# Patient Record
Sex: Female | Born: 1980
Health system: Southern US, Community
[De-identification: ages and names within clinical notes are randomized; demographics above are authoritative.]

## PROBLEM LIST (undated history)

## (undated) HISTORY — PX: BREAST SURGERY: SHX581

---

## 2010-07-27 ENCOUNTER — Emergency Department (HOSPITAL_COMMUNITY)
Admission: EM | Admit: 2010-07-27 | Discharge: 2010-07-27 | Disposition: A | Discharge status: Home / Self Care | Payer: Self-pay | Attending: Emergency Medicine | Admitting: Emergency Medicine

## 2010-07-27 DIAGNOSIS — Z0000 Encounter for general adult medical examination without abnormal findings: Secondary | ICD-10-CM | POA: Insufficient documentation

## 2010-07-27 DIAGNOSIS — N898 Other specified noninflammatory disorders of vagina: Secondary | ICD-10-CM | POA: Insufficient documentation

## 2010-07-27 LAB — POCT PREGNANCY, URINE: Preg Test, Ur: NEGATIVE

## 2012-02-23 HISTORY — PX: AUGMENTATION MAMMAPLASTY: SUR837

## 2013-01-15 ENCOUNTER — Other Ambulatory Visit (HOSPITAL_COMMUNITY)
Admission: RE | Admit: 2013-01-15 | Discharge: 2013-01-15 | Disposition: A | Discharge status: Home / Self Care | Payer: Self-pay | Source: Ambulatory Visit | Attending: Gynecology | Admitting: Gynecology

## 2013-01-15 ENCOUNTER — Ambulatory Visit (INDEPENDENT_AMBULATORY_CARE_PROVIDER_SITE_OTHER): Payer: Self-pay | Admitting: Gynecology

## 2013-01-15 ENCOUNTER — Encounter: Payer: Self-pay | Admitting: Gynecology

## 2013-01-15 VITALS — BP 124/78 | Ht 65.5 in | Wt 126.0 lb

## 2013-01-15 DIAGNOSIS — R87810 Cervical high risk human papillomavirus (HPV) DNA test positive: Secondary | ICD-10-CM | POA: Insufficient documentation

## 2013-01-15 DIAGNOSIS — Z1151 Encounter for screening for human papillomavirus (HPV): Secondary | ICD-10-CM | POA: Insufficient documentation

## 2013-01-15 DIAGNOSIS — Z3009 Encounter for other general counseling and advice on contraception: Secondary | ICD-10-CM

## 2013-01-15 DIAGNOSIS — Z124 Encounter for screening for malignant neoplasm of cervix: Secondary | ICD-10-CM | POA: Insufficient documentation

## 2013-01-15 DIAGNOSIS — Z01419 Encounter for gynecological examination (general) (routine) without abnormal findings: Secondary | ICD-10-CM

## 2013-01-15 LAB — CBC WITH DIFFERENTIAL/PLATELET
Basophils Absolute: 0 10*3/uL (ref 0.0–0.1)
Eosinophils Relative: 2 % (ref 0–5)
Lymphocytes Relative: 33 % (ref 12–46)
Lymphs Abs: 2.2 10*3/uL (ref 0.7–4.0)
MCV: 84.3 fL (ref 78.0–100.0)
Neutro Abs: 3.7 10*3/uL (ref 1.7–7.7)
Neutrophils Relative %: 57 % (ref 43–77)
Platelets: 271 10*3/uL (ref 150–400)
RBC: 4.89 MIL/uL (ref 3.87–5.11)
RDW: 13.4 % (ref 11.5–15.5)
WBC: 6.5 10*3/uL (ref 4.0–10.5)

## 2013-01-15 LAB — CHOLESTEROL, TOTAL: Cholesterol: 175 mg/dL (ref 0–200)

## 2013-01-15 NOTE — Progress Notes (Signed)
Evelyn Beck 1981-01-08 [id number]   History:    32 y.o.  for annual gyn exam who stated that in [place] last year she was told that she had the HPV virus and had been tested because so was her husband? Patient is using barrier contraception and wanted to discuss contraceptive options. 4 months ago she had silicone implants placed in [place]. She has cycles that last 5-7 days once a month. She had 2 normal spontaneous vaginal deliveries at term without any complications. She declined a flu vaccine today.  Past medical history,surgical history, family history and social history were all reviewed and documented in the EPIC chart.  Gynecologic History Patient's last menstrual period was 12/23/2012. Contraception: condoms Last Pap: 2012-2013?Marland Kitchen Results were: HPV changes? Last mammogram: 2014 prior to placement of silicone implants in [place]. Results were: normal  Obstetric History OB History  Gravida Para Term Preterm AB SAB TAB Ectopic Multiple Living  2 2        2     # Outcome Date GA Lbr Len/2nd Weight Sex Delivery Anes PTL Lv  2 PAR           1 PAR                ROS: A ROS was performed and pertinent positives and negatives are included in the history.  GENERAL: No fevers or chills. HEENT: No change in vision, no earache, sore throat or sinus congestion. NECK: No pain or stiffness. CARDIOVASCULAR: No chest pain or pressure. No palpitations. PULMONARY: No shortness of breath, cough or wheeze. GASTROINTESTINAL: No abdominal pain, nausea, vomiting or diarrhea, melena or bright red blood per rectum. GENITOURINARY: No urinary frequency, urgency, hesitancy or dysuria. MUSCULOSKELETAL: No joint or muscle pain, no back pain, no recent trauma. DERMATOLOGIC: No rash, no itching, no lesions. ENDOCRINE: No polyuria, polydipsia, no heat or cold intolerance. No recent change in weight. HEMATOLOGICAL: No anemia or easy bruising or bleeding. NEUROLOGIC: No headache, seizures, numbness, tingling  or weakness. PSYCHIATRIC: No depression, no loss of interest in normal activity or change in sleep pattern.     Exam: chaperone present  BP 124/78  Ht 5' 5.5" (1.664 m)  Wt 126 lb (57.153 kg)  BMI 20.64 kg/m2  LMP 12/23/2012  Body mass index is 20.64 kg/(m^2).  General appearance : Well developed well nourished female. No acute distress HEENT: Neck supple, trachea midline, no carotid bruits, no thyroidmegaly Lungs: Clear to auscultation, no rhonchi or wheezes, or rib retractions  Heart: Regular rate and rhythm, no murmurs or gallops Breast:Examined in sitting and supine position were symmetrical in appearance, no palpable masses or tenderness,  no skin retraction, no nipple inversion, no nipple discharge, no skin discoloration, no axillary or supraclavicular lymphadenopathy,implants bilateral Abdomen: no palpable masses or tenderness, no rebound or guarding Extremities: no edema or skin discoloration or tenderness  Pelvic:  Bartholin, Urethra, Skene Glands: Within normal limits             Vagina: No gross lesions or discharge  Cervix: No gross lesions or discharge  Uterus  anteverted, normal size, shape and consistency, non-tender and mobile  Adnexa  Without masses or tenderness  Anus and perineum  normal   Rectovaginal  normal sphincter tone without palpated masses or tenderness             Hemoccult not indicated     Assessment/Plan:  32 y.o. female for annual exam who would like to try the NuvaRing for contraception  first. Literature information on the Nuvaring as well as the Mirena and ParaGard T380A was provided. Patient denies any bleeding disorders or clotting disorders in her family. The following labs were ordered CBC, screen cholesterol, urinalysis, and Pap smear with Cotesting. Patient was reminded to do her monthly breast exam.  Note: This dictation was prepared with  Dragon/digital dictation along withSmart phrase technology. Any transcriptional errors that result  from this process are unintentional.   Ok Edwards MD, 11:39 AM 01/15/2013

## 2013-01-16 LAB — URINALYSIS W MICROSCOPIC + REFLEX CULTURE
Bilirubin Urine: NEGATIVE
Crystals: NONE SEEN
Nitrite: NEGATIVE
Protein, ur: NEGATIVE mg/dL
Specific Gravity, Urine: 1.022 (ref 1.005–1.030)
Urobilinogen, UA: 0.2 mg/dL (ref 0.0–1.0)

## 2013-01-22 ENCOUNTER — Telehealth: Payer: Self-pay | Admitting: Gynecology

## 2013-01-22 NOTE — Telephone Encounter (Signed)
01/22/13-Pt was informed today that her cost for the Mirena & insertion would be $1.485.00. She is private pay. She will check and get back to [place] if she wants to proceed. Informed her this needs to be done on cycle. WL

## 2013-01-23 ENCOUNTER — Telehealth: Payer: Self-pay

## 2013-01-23 ENCOUNTER — Encounter: Payer: Self-pay | Admitting: Gynecology

## 2013-01-23 NOTE — Telephone Encounter (Signed)
I spoke with patient a little bit earlier about her pap smear result and need for C&B.  She is calling back with a question and she wanted to know if we did STD testing when pap smear was done.  I did explain the only testing was HPV and she did test positive for HR HPV but that if she wanted STD testing done she would need to return.  She asked what blood tests we had checked and was informed. She asked if HIV checked and I told her no but that with a return visit we could do complete STD testing for her.

## 2013-02-02 ENCOUNTER — Telehealth: Payer: Self-pay | Admitting: *Deleted

## 2013-02-02 MED ORDER — ETONOGESTREL-ETHINYL ESTRADIOL 0.12-0.015 MG/24HR VA RING: 1.0000 | VAGINAL_RING | VAGINAL | Status: DC

## 2013-02-02 NOTE — Telephone Encounter (Signed)
Pt was given samples of nuvaring on OV 01/15/13 states ring worked will would like rx sent to pharmacy. This would be done.

## 2013-02-27 ENCOUNTER — Ambulatory Visit: Payer: Self-pay | Admitting: Gynecology

## 2013-03-21 ENCOUNTER — Ambulatory Visit (INDEPENDENT_AMBULATORY_CARE_PROVIDER_SITE_OTHER): Payer: Self-pay | Admitting: Gynecology

## 2013-03-21 ENCOUNTER — Encounter: Payer: Self-pay | Admitting: Gynecology

## 2013-03-21 VITALS — BP 128/88

## 2013-03-21 DIAGNOSIS — N870 Mild cervical dysplasia: Secondary | ICD-10-CM

## 2013-03-21 DIAGNOSIS — Z113 Encounter for screening for infections with a predominantly sexual mode of transmission: Secondary | ICD-10-CM

## 2013-03-21 MED ORDER — NORETHIN ACE-ETH ESTRAD-FE 1-20 MG-MCG PO TABS: 1.0000 | ORAL_TABLET | Freq: Every day | ORAL | Status: DC

## 2013-03-21 NOTE — Patient Instructions (Signed)
Informacin sobre los Electronics engineer (Oral Contraception Information) Los anticonceptivos orales (ACO) son medicamentos que se utilizan para Therapist, occupational. Su funcin es [employer] Corporation ovarios liberen vulos. Las hormonas de los ACO tambin hacen que el moco cervical se haga ms espeso, lo que evita que el esperma ingrese al tero. Tambin hacen que la membrana que recubre internamente al tero se vuelva ms fina, lo que no permite que el huevo fertilizado se adhiera a la pared del tero. Los ACO son muy efectivos cuando se toman exactamente como se prescriben. Sin embargo, no previenen contra las enfermedades de transmisin sexual (ETS). La prctica del sexo seguro, como el uso de preservativos, junto con la [place], [place] a prevenir ese tipo de enfermedades.  Antes de tomar la pldora, usted debe hacerse un examen fsico y un test de Pap. El mdico podr indicarle anlisis de [place], si es necesario. El mdico se asegurar de que usted sea [place] buena candidata para usar anticonceptivos orales. Converse con su mdico acerca de los posibles efectos secundarios de los ACO que podran recetarle. Cuando se inicia el uso de ACO, puede llevar 2 a 3 meses para que su organismo se adapte a los cambios en los niveles hormonales.  TIPOS DE ANTICONCEPTIVOS ORALES  Pldora combinada: esta pldora contiene las hormonas estrgeno y progestina (progesterona sinttica). La pldora combinada viene en envases para [address], [place]. Algunos tipos de pldoras combinadas deben tomarse de manera continua (pldoras para 365 das). En los envases para [address] usted no tomar las pldoras durante 7 das despus de la ltima pldora. En los envases para [address] la pldora se toma [employer]. Las ltimas 7 no contienen hormonas. Ciertos tipos de pldoras tienen ms de 21 pldoras que contienen hormonas. En los envases para [address] las primeras 84 pldoras contienen ambas hormonas y las ltimas 7 pldoras  no contienen hormonas o contienen slo Therapist, occupational.  La minipldora: esta pldora contiene la hormona progesterona solamente. Es necesario tomarla todos los das de [place] continua. Es importante que las tome a la misma hora todos [place]. Viene en envases de 28 pldoras. Las 28 pldoras contienen la hormona.  [address] los sntomas premenstruales.   Se [place] para tratar los MGM [employer]AGE.   Regula el ciclo menstrual.   Disminuye el ciclo menstrual abundante.   Puede mejorar el acn, segn el tipo de pldora.   Trata hemorragias uterinas anormales.   Trata el sndrome ovrico poliqustico.   Trata la endometriosis.   Pueden usarse como anticonceptivo de Freight forwarder.  FACTORES QUE PUEDEN HACER QUE LOS ANTICONCEPTIVOS ORALES SEAN MENOS EFECTIVOS Pueden ser menos efectivos si:   Olvid tomar la [employer] a la misma hora.   Tiene una enfermedad estomacal o intestinal que disminuye la absorcin de la pldora.   Ingiere simultneamente los anticonceptivos orales junto con otros medicamentos que los hacen menos efectivos, como antibiticos, ciertos medicamentos para el VIH y algunos medicamentos para las convulsiones.   Usted toma anticonceptivos orales que han vencido.   Cuando se [place] el envase de 21 das, se olvida de recomenzar el uso [employer] 7.  RIESGOS ASOCIADOS AL USO DE ANTICONCEPTIVOS ORALES  Los anticonceptivos orales pueden en algunos casos causar efectos secundarios como:  Dolor de [place].  Nuseas.  Inflamacin mamaria.  Hemorragia vaginal o manchado irregular. Las pldoras combinadas tambin se asocian a un pequeo aumento en el riesgo de:  Cogulos  sanguneos.  Ataque cardaco.  Ictus. Document Released: 11/18/2004 Document Revised: 11/29/2012 [facility] Patient Information 2014 [place], [place]. Colposcopa (Colposcopy) La colposcopa es un procedimiento para examinar el cuello del  tero y la vagina o la zona externa alrededor de la vagina, para buscar signos de enfermedad o anormalidades. Para realizar este procedimiento se utiliza un microscopio con luz, llamado colposcopio. Durante el procedimiento podrn tomarle muestras de tejido, en caso que el profesional encuentre clulas anormales. La colposcopa se indica si la mujer tiene:  Papanicolau anormal. El Papanicolaou es un examen mdico que se realiza para Chief of Staff las clulas que estn en la superficie del cuello uterino.  Un resultado de Pap que podra indicar la presencia del virus del Engineer, technical sales (VPH). El virus puede producir verrugas genitales y est relacionado con el desarrollo de cncer cervical.  Una lcera en el cuello del tero y el resultado del Pap fue normal.  Se han observado verrugas genitales en el cuello del tero o en la zona externa de la vagina.  Una madre que ha consumido dietilstilbestrol (DES) durante el Media planner.  Relaciones sexuales dolorosas.  Hemorragias vaginales, especialmente despus de Retail banker. INFORME A SU MDICO:  Cualquier alergia que tenga.  Todos los [employer] [place], incluyendo vitaminas, hierbas, gotas oftlmicas, cremas y medicamentos de venta libre.  Problemas previos que usted o los UnitedHealth de su familia hayan tenido con el uso de anestsicos.  Enfermedades de [employer].  Cirugas previas.  Padecimientos mdicos. RIESGOS Y COMPLICACIONES En general, la colposcopa es un procedimiento seguro. Sin embargo, Games developer procedimiento, pueden surgir complicaciones. Las complicaciones posibles son:  Hemorragias.  Infeccin.  Lesiones que no se detectan. ANTES DEL PROCEDIMIENTO   Informe al mdico si tiene el perodo menstrual. En general, la colposcopa no se realiza Paediatric nurse.  Durante las 24 horas previas a la colposcopa no debe:  Patent attorney.  Usar tampones.  Aplicarse medicamentos,  cremas o supositorios en la vagina.  Tener [employer]. PROCEDIMIENTO  Durante el procedimiento, estar acostada sobre la espalda con los pies en los soportes (estribos). Le colocarn el la vagina un instrumento metlico o plstico entibiado espculo) para [place] y permitir al profesional visualizar el cuello del tero. El colposcopio se coloca fuera de la vagina. Este instrumento se [place] para ampliar y examinar el cuello del tero, la vagina y la zona externa de la misma. Se aplica una pequea cantidad de solucin lquida en la zona a observar. Este lquido facilita la observacin de clulas anormales. El mdico utilizar instrumentos para aspirar la mucosidad y las clulas del canal del cuello del tero. Luego registrar la ubicacin de las reas anormales. Si le hacen una biopsia durante el procedimiento, le aplicarn un medicamento para adormecer la zona (anestsico local). Podr sentir Clinical cytogeneticist o clicos leves mientras le hacen la biopsia. Despus del procedimiento, las muestras de tejido Publishing copy durante la biopsia se enviarn al laboratorio para ser [employer]. DESPUS DEL PROCEDIMIENTO  Le darn instrucciones para que concurra al control con su mdico para recibir los Nation[employer] Insurance. Es importante que cumpla con todas las visitas. Document Released: 02/08/2005 Document Revised: 10/11/2012 [facility] Patient Information 2014 [place], [place].

## 2013-03-21 NOTE — Progress Notes (Signed)
   Patient is a 33 year old who was seen as a new patient on 01/15/2013. Patient had stated that in the lower [place] 2013 she was told that she had the HPV varus. She has been using barrier contraception. She is informed that her husband has been referred to infectious disease specialist for some genital irritation that he's experiencing she does not recall the name. She wanted to have an STD screen today. She is otherwise asymptomatic. Her Pap smear done here in the office on 01/15/2013 demonstrating the following:  LOW GRADE SQUAMOUS INTRAEPITHELIAL LESION: CIN-1/ HPV (LSIL).   High-risk HPV had been detected on the specimen and on December 1 HPV 16/18 genotype was negative for 16/18/45  Patient underwent a colposcopic evaluation today with the following:  Physical Exam  Genitourinary:     Detailed colposcopic evaluation was undertaken. The external genitalia, perineum, perirectal region were inspected no lesions were seen. After the speculum was inserted into the vagina acetic acid was applied. After a systematic evaluation the vagina, fornix, and cervix it was noted that she had a small acetowhite area at the 8:00 position which was biopsied. Transformation zone was fully visualized. ECC was obtained. Monsel solution was used for hemostasis.  Assessment/plan: Patient with cervical dysplasia mild on Pap smear, HPV 16, 18, and 45 genotype not present. We will await further results of the biopsy report and manage according to her new guidelines. Patient wanted a full STD screening. Since we started the colposcopic evaluation she will need to return back in 2 weeks [facility] and chlamydia culture and wet prep. No gross lesions were seen. She will stop by the lab we'll check her HIV, RPR, hepatitis B and C. Literature and information on dysplasia was provided in Spanish.

## 2013-03-22 LAB — RPR

## 2013-03-22 LAB — HEPATITIS B SURFACE ANTIGEN: Hepatitis B Surface Ag: NEGATIVE

## 2013-03-22 LAB — HIV ANTIBODY (ROUTINE TESTING W REFLEX): HIV: NONREACTIVE

## 2013-03-22 LAB — HEPATITIS C ANTIBODY: HCV Ab: NEGATIVE

## 2013-04-02 ENCOUNTER — Ambulatory Visit (INDEPENDENT_AMBULATORY_CARE_PROVIDER_SITE_OTHER): Payer: Self-pay | Admitting: Gynecology

## 2013-04-02 ENCOUNTER — Encounter: Payer: Self-pay | Admitting: Gynecology

## 2013-04-02 VITALS — BP 120/82

## 2013-04-02 DIAGNOSIS — B9689 Other specified bacterial agents as the cause of diseases classified elsewhere: Secondary | ICD-10-CM

## 2013-04-02 DIAGNOSIS — A499 Bacterial infection, unspecified: Secondary | ICD-10-CM

## 2013-04-02 DIAGNOSIS — N760 Acute vaginitis: Secondary | ICD-10-CM

## 2013-04-02 DIAGNOSIS — Z113 Encounter for screening for infections with a predominantly sexual mode of transmission: Secondary | ICD-10-CM

## 2013-04-02 LAB — WET PREP FOR TRICH, YEAST, CLUE
TRICH WET PREP: NONE SEEN
YEAST WET PREP: NONE SEEN

## 2013-04-02 MED ORDER — METRONIDAZOLE 500 MG PO TABS: 500.0000 mg | ORAL_TABLET | Freq: Two times a day (BID) | ORAL | Status: DC

## 2013-04-02 MED ORDER — NORETHIN ACE-ETH ESTRAD-FE 1-20 MG-MCG PO TABS: ORAL_TABLET | ORAL | Status: DC

## 2013-04-02 NOTE — Patient Instructions (Signed)
Metronidazole tablets or capsules Qu es este medicamento? El METRONIDAZOL es un antiinfeccioso. Se utiliza en el tratamiento de ciertos tipos de infecciones bacterianas y por protozoos. No es efectivo para resfros, gripe u otras infecciones de origen viral. Este medicamento puede ser utilizado para otros usos; si tiene alguna pregunta consulte con su proveedor de atencin mdica o con su farmacutico. MARCAS COMERCIALES DISPONIBLES: Flagyl Qu le debo informar a mi profesional de la salud antes de tomar este medicamento? Necesita saber si usted presenta alguno de los siguientes problemas o situaciones: -anemia u otros trastornos sanguneos -enfermedad del sistema nervioso -infeccin mictica o por levadura -si consume bebidas alcohlicas -enfermedad heptica -convulsiones -una reaccin alrgica o inusual al metronidazol, a otros medicamentos, alimentos, colorantes o conservantes -si est embarazada o buscando quedar embarazada -si est amamantando a un beb Cmo debo utilizar este medicamento? Tome este medicamento por va oral con un vaso lleno de agua. Siga las instrucciones de la etiqueta del [place]. Tome sus dosis a intervalos regulares. No tome su medicamento con una frecuencia mayor a la indicada. Complete todo el tratamiento con el medicamento como recetado aun si se siente mejor. No omita ninguna dosis o suspenda el uso de su medicamento antes de lo indicado. Hable con su pediatra para informarse acerca del uso de este medicamento en nios. Puede requerir atencin especial. Sobredosis: Pngase en contacto inmediatamente con un centro toxicolgico o una sala de urgencia si usted cree que haya tomado demasiado medicamento. ATENCIN: [employer] es solo para usted. No comparta este medicamento con nadie. Qu sucede si me olvido de una dosis? Si olvida una dosis, tmela lo antes posible. Si es casi la hora de la prxima dosis, tome slo esa dosis. No tome dosis adicionales o  dobles. Qu puede interactuar con este medicamento? No tome esta medicina con ninguno de los siguientes medicamentos: -alcohol o cualquier producto que contiene alcohol -solucin oral de amprenavir -cisapride -disulfiram -dofetilida -dronedarona -inyeccin de paclitaxel -pimozida -solucin oral de ritonavir -solucin oral de sertralina -inyeccin de sulfametoxasol-trimetoprima -tioridazina -ziprasidona Esta medicina tambin puede interactuar con los siguientes medicamentos: -cimetidina -litio -otros medicamentos que prolongan el intervalo QT (provoca un ritmo cardiaco anormal) -fenobarbital -fenitona -warfarina Puede ser que esta lista no menciona todas las posibles interacciones. Informe a su profesional de [employer]	[place] de [employer] Corporation productos a base de hierbas, medicamentos de [place] o suplementos nutritivos que est tomando. Si usted fuma, consume bebidas alcohlicas o si utiliza drogas ilegales, indqueselo tambin a su profesional de KB Home	[place]. Algunas sustancias pueden interactuar con su medicamento. A qu debo estar atento al usar Coca-Cola? Consulte a su mdico o a su profesional de la salud si sus sntomas no mejoran o si empeoran. Puede experimentar mareos o somnolencia. No conduzca ni utilice maquinaria ni haga nada que Associate Professor en estado de alerta hasta que sepa cmo le afecta este medicamento. No se siente ni se ponga de pie con rapidez, especialmente si es un paciente de edad avanzada. Esto reduce el riesgo de mareos o [employer]. Evite las bebidas alcohlicas durante el tratamiento con este medicamento y [employer] Stores tres das siguientes. El alcohol puede causarle mareos o hacerlo sentir enfermo o ruborizado. Si est recibiendo tratamiento para una enfermedad de transmisin sexual, no tenga relaciones sexuales hasta que haya completado el [place]. Es posible que su pareja tambin necesite [place]. Qu efectos secundarios puedo tener al [employer] Corporation  este medicamento? Efectos secundarios que debe informar a su mdico o a Barrister's clerk de Technical sales engineer  tan pronto como sea posible: -reacciones alrgicas como erupcin cutnea o urticarias, hinchazn de la cara, labios o lengua -confusin, torpeza -dificultad para hablar -mareos -decoloracin o dolor de la boca -fiebre, infeccin -entumecimiento, hormigueo, dolor o debilidad en las manos o los pies -dificultad para orinar o cambios en el volumen de orina -enrojecimiento, formacin de ampollas, descamacin o distensin de la piel, inclusive dentro de la boca -convulsiones -cansancio o debilidad inusual -irritacin, resequedad o flujo de la vagina  Efectos secundarios que, por lo general, no requieren atencin mdica (debe informarlos a su mdico o a su profesional de la salud si persisten o si son molestos): -diarrea -dolor de cabeza -irritabilidad -sabor metlico -nuseas -calambres o dolores estomacales -dificultad para conciliar el sueo Puede ser que esta lista no menciona todos los posibles efectos secundarios. Comunquese a su mdico por asesoramiento mdico Humana Inc. Usted puede informar los efectos secundarios a la FDA por telfono al 1-800-FDA-1088. Dnde debo guardar mi medicina? Mantngala fuera del alcance de los nios. Gurdela a [employer] Group, menos de 25 grados C (77 grados F). Protjala de la luz. Mantenga el envase bien cerrado. Deseche todo el medicamento que no haya utilizado, despus de la fecha de vencimiento. ATENCIN: Este folleto es un resumen. Puede ser que no cubra toda la posible informacin. Si usted tiene preguntas acerca de esta medicina, consulte con su mdico, su farmacutico o su profesional de Technical sales engineer.  2014, Elsevier/Gold Standard. (2012-09-05 16:25:47) Vaginosis bacteriana (Bacterial Vaginosis) La vaginosis bacteriana es una infeccin vaginal que perturba el equilibrio normal de las bacterias que se encuentran en la vagina.  Es el resultado de un crecimiento excesivo de ciertas bacterias. Esta es la infeccin vaginal ms frecuente en mujeres en edad reproductiva. El tratamiento es importante para prevenir complicaciones, especialmente en mujeres embarazadas, dado que puede causar un parto prematuro. CAUSAS  La vaginosis bacteriana se origina por un aumento de bacterias nocivas que, generalmente, estn presentes en cantidades ms pequeas en la vagina. Varios tipos diferentes de bacterias pueden causar esta afeccin. Sin embargo, la causa de su desarrollo no se comprende totalmente. Vander o comportamientos pueden exponerlo a un mayor riesgo de desarrollar vaginosis bacteriana, entre los que se incluyen:  Tener una nueva pareja sexual o mltiples parejas sexuales.  Las duchas vaginales  El uso del DIU (dispositivo intrauterino) como mtodo anticonceptivo. El contagio no se produce en baos, por ropas de cama, en piscinas o por contacto con objetos. SIGNOS Y SNTOMAS  Algunas mujeres que padecen vaginosis bacteriana no presentan signos ni sntomas. Los sntomas ms comunes son:  Secrecin vaginal de color grisceo.  Secrecin vaginal con olor similar al [employer], especialmente despus de Retail banker.  Picazn o sensacin de ardor en la vagina o la vulva.  Ardor o dolor al [employer]. DIAGNSTICO  Su mdico analizar su historia clnica y le examinar la vagina para detectar signos de vaginosis bacteriana. Puede tomarle Truddie Coco de flujo vaginal. Su mdico examinar esta muestra con un microscopio para controlar las bacterias y clulas anormales. Tambin puede realizarse un anlisis del pH vaginal.  TRATAMIENTO  La vaginosis bacteriana puede tratarse con antibiticos, en forma de comprimidos o de crema vaginal. Puede indicarse una segunda tanda de antibiticos si la afeccin se repite despus del tratamiento.  [place] solo  medicamentos de venta libre o recetados, segn las indicaciones del mdico.  Si le han recetado antibiticos, tmelos como se le indic.  Asegrese de que finaliza la prescripcin completa aunque se sienta mejor.  No mantenga relaciones sexuales Animator.  Comunique a sus compaeros sexuales que sufre una infeccin vaginal. Deben consultar a su mdico y recibir tratamiento si tienen problemas, como picazn o una erupcin cutnea leve.  Practique el sexo seguro usando preservativos y tenga un nico compaero sexual. SOLICITE ATENCIN MDICA SI:   Sus sntomas no mejoran despus de 3 das de [place].  Aumenta la secrecin o Conservation officer, historic buildings.  Tiene fiebre. ASEGRESE DE QUE:   Comprende estas instrucciones.  Controlar su afeccin.  Recibir ayuda de inmediato si no mejora o si empeora. PARA OBTENER MS INFORMACIN  Centros para el control y la prevencin de Probation officer for Disease Control and Prevention, CDC): AppraiserFraud.fi Asociacin Estadounidense de la Salud Sexual (American Sexual Health Association, SHA): www.ashastd.org  Document Released: 05/18/2007 Document Revised: 11/29/2012 [facility] Patient Information 2014 [place], [place].

## 2013-04-02 NOTE — Progress Notes (Signed)
   Patient presented to the office today for discussion of her last office visit whereby she underwent colposcopic directed biopsy as a result of Pap smear in November 2014 which had demonstrated the following:  LOW GRADE SQUAMOUS INTRAEPITHELIAL LESION: CIN-1/ HPV (LSIL). High-risk HPV had been detected on the specimen and on December 1 HPV 16/18 genotype was negative for 16/18/45  PDiagnosis 1. Endocervix, curettage - SCANT BENIGN ENDOCERVIX WITH ABUNDANT MUCUS AND BLOOD. 2. Cervix, biopsy, 8 o'clock - BENIGN ENDOCERVIX AND MUCUS. - NO SQUAMOUS TISSUE PRESENT.athology report demonstrated the following:  Patient also had informed me that her partner has continued to have some penile lesion and is being referred to infectious disease. In January 28 at this year patient had a negative RPR, hepatitis B, hepatitis C and HIV. She's here today for GC and chlamydia culture as well as a wet prep since she was having a small discharge with odor.  Pelvic exam: Bartholin urethra Skene was within normal limits Vagina: Grade fish like to order discharge was evident. GC and chlamydia culture was obtained along with wet prep. Cervix: Same as above Bimanual exam: Not done Rectal exam: Not done  Wet prep: Pos Amine, moderate clue cells few WBCs and too numerous to count bacteria  Assessment/plan: #1 bacterial vaginosis will be treated with Flagyl 500 mg one by mouth twice a day for 7 days. Will await the results of the cultures. Will also await patient's husband's evaluation from infectious disease. #2 according to the new ASCCP guidelines patients with low-grade dysplasia negative HPV 16 and 18/45 can be followed up a Pap smear with co-testing in one year. Patient is currently on oral contraceptive pills for contraception.

## 2013-04-03 LAB — GC/CHLAMYDIA PROBE AMP
CT PROBE, AMP APTIMA: NEGATIVE
GC Probe RNA: NEGATIVE

## 2013-12-24 ENCOUNTER — Encounter: Payer: Self-pay | Admitting: Gynecology

## 2014-06-24 ENCOUNTER — Emergency Department (HOSPITAL_BASED_OUTPATIENT_CLINIC_OR_DEPARTMENT_OTHER): Payer: 59

## 2014-06-24 ENCOUNTER — Encounter (HOSPITAL_BASED_OUTPATIENT_CLINIC_OR_DEPARTMENT_OTHER): Payer: Self-pay | Admitting: *Deleted

## 2014-06-24 ENCOUNTER — Emergency Department (HOSPITAL_BASED_OUTPATIENT_CLINIC_OR_DEPARTMENT_OTHER)
Admission: EM | Admit: 2014-06-24 | Discharge: 2014-06-24 | Disposition: A | Discharge status: Home / Self Care | Payer: 59 | Attending: Emergency Medicine | Admitting: Emergency Medicine

## 2014-06-24 DIAGNOSIS — Y9289 Other specified places as the place of occurrence of the external cause: Secondary | ICD-10-CM | POA: Diagnosis not present

## 2014-06-24 DIAGNOSIS — Y998 Other external cause status: Secondary | ICD-10-CM | POA: Insufficient documentation

## 2014-06-24 DIAGNOSIS — Y9389 Activity, other specified: Secondary | ICD-10-CM | POA: Insufficient documentation

## 2014-06-24 DIAGNOSIS — S92415A Nondisplaced fracture of proximal phalanx of left great toe, initial encounter for closed fracture: Secondary | ICD-10-CM | POA: Insufficient documentation

## 2014-06-24 DIAGNOSIS — Z792 Long term (current) use of antibiotics: Secondary | ICD-10-CM | POA: Insufficient documentation

## 2014-06-24 DIAGNOSIS — Z79899 Other long term (current) drug therapy: Secondary | ICD-10-CM | POA: Diagnosis not present

## 2014-06-24 DIAGNOSIS — S92912A Unspecified fracture of left toe(s), initial encounter for closed fracture: Secondary | ICD-10-CM

## 2014-06-24 DIAGNOSIS — S99922A Unspecified injury of left foot, initial encounter: Secondary | ICD-10-CM | POA: Diagnosis present

## 2014-06-24 DIAGNOSIS — W108XXA Fall (on) (from) other stairs and steps, initial encounter: Secondary | ICD-10-CM | POA: Diagnosis not present

## 2014-06-24 MED ORDER — KETOROLAC TROMETHAMINE 60 MG/2ML IM SOLN
60.0000 mg | Freq: Once | INTRAMUSCULAR | Status: AC
  Administered 2014-06-24: 60 mg via INTRAMUSCULAR
  Filled 2014-06-24: qty 2

## 2014-06-24 NOTE — ED Provider Notes (Signed)
CSN: [id number]     Arrival date & time 06/24/14  1041 History   First MD Initiated Contact with Patient 06/24/14 1100     Chief Complaint  Patient presents with  . Foot Pain     (Consider location/radiation/quality/duration/timing/severity/associated sxs/prior Treatment) HPI  Maximina Pirozzi is a 34 y.o. female presenting with slipping on two steps yesterday and her left big toe "went underneath and I head a crack." pt reported swelling and erythema with improved with elevation and ice. Pt also took tylenol yesterday with mild improvement of her pain. Improvement with elevation and ICE. Nothing today. No numbness tingling. Pt with ecchymoses today. Pt states she bruises easily. No anticoagulants. Pt ambulatory. No fevers or chills.   History reviewed. No pertinent past medical history. Past Surgical History  Procedure Laterality Date  . Breast surgery      right lumpectomy  . Augmentation mammaplasty  2014    silicone-    Family History  Problem Relation Age of Onset  . Hypertension Father   . Heart disease Father   . Cancer Maternal Aunt     ?- cervical or uterine  . Cancer Maternal Uncle     leukemia  . Diabetes Paternal Grandmother   . Stroke Sister    History  Substance Use Topics  . Smoking status: Never Smoker   . Smokeless tobacco: Not on file  . Alcohol Use: Yes     Comment: socially   OB History    Gravida Para Term Preterm AB TAB SAB Ectopic Multiple Living   Review of Systems  Musculoskeletal: Positive for myalgias. Negative for gait problem.  Skin: Positive for color change. Negative for wound.  Neurological: Negative for weakness and numbness.      Allergies  Review of patient's allergies indicates no known allergies.  Home Medications   Prior to Admission medications   Medication Sig Start Date End Date Taking? Authorizing Provider  metroNIDAZOLE (FLAGYL) 500 MG tablet Take 1 tablet (500 mg total) by mouth 2 (two) times daily.  04/02/13   Ok Edwards, MD  Multiple Vitamins-Minerals (MULTIVITAMIN WITH IRON-MINERALS) liquid Take by mouth daily.    Historical Provider, MD  norethindrone-ethinyl estradiol (JUNEL FE,GILDESS FE,LOESTRIN FE) 1-20 MG-MCG tablet Take one daily/continous regimen to withdrawal q 3 mos 04/02/13   Ok Edwards, MD   BP 129/79 mmHg  Pulse 79  Temp(Src) 98 F (36.7 C) (Oral)  Resp 18  Ht  (1.702 m)  Wt 133 lb (60.328 kg)  BMI 20.83 kg/m2  SpO2 100%  LMP 06/10/2014 Physical Exam  Constitutional: She appears well-developed and well-nourished. No distress.  HENT:  Head: Normocephalic and atraumatic.  Eyes: Conjunctivae are normal. Right eye exhibits no discharge. Left eye exhibits no discharge.  Cardiovascular:  2+ DP and PT pulses equal bilaterally  Pulmonary/Chest: Effort normal. No respiratory distress.  Musculoskeletal:  Left great toe with ecchymoses and mild swelling. Tender to palpation. Remainder of foot without tenderness, erythema, swelling. Full range of motion of left ankle without pain.  Neurological: She is alert. Coordination normal.  Strength and sensation intact.  Skin: She is not diaphoretic.  Psychiatric: She has a normal mood and affect. Her behavior is normal.  Nursing note and vitals reviewed.   ED Course  Procedures (including critical care time) Labs Review Labs Reviewed - No data to display  Imaging Review Dg Foot Complete Left  06/24/2014  CLINICAL DATA:  Left big toe swelling and bruising, slipped and fell yesterday  EXAM: LEFT FOOT - COMPLETE 3+ VIEW  COMPARISON:  None.  FINDINGS: Three views of the left foot submitted. There is nondisplaced fracture of proximal phalanx left great toe. Adjacent soft tissue swelling.  IMPRESSION: Nondisplaced fracture of proximal phalanx left great toe.   Electronically Signed   By: Natasha MeadLiviu  Pop M.D.   On: 06/24/2014 11:33     EKG Interpretation None      MDM   Final diagnoses:  Phalanx fracture, foot, left,  closed, initial encounter   Patient presenting after mechanical fall with left great toe pain. Neurovascularly intact. Pain managed in the ED. X-ray with evidence of nondisplaced fracture proximal phalanx of left great toe. Toe buddy taped and patient placed in surgical shoe with solid sole. Stress importance of rice and ibuprofen use and to follow-up with orthopedics/sports medicine family. Patient to be touchdown weightbearing and discussed importance of toe immobilization. Pt agreeable.   Discussed return precautions with patient. Discussed all results and patient verbalizes understanding and agrees with plan.   Oswaldo ConroyVictoria Lesieli Bresee, PA-C 06/26/14 [phone]  Blane OharaJoshua Zavitz, MD 06/29/14 [phone]

## 2014-06-24 NOTE — Discharge Instructions (Signed)
Return to the emergency room with worsening of symptoms, new symptoms or with symptoms that are concerning, especially numbness, tingling weakness, cold, increased swelling erythema or bruising, fevers. RICE: Rest, Ice (three cycles of 20 mins on, 20mins off at least twice a day), compression/brace, elevation. Heating pad works well for back pain. Ibuprofen 400mg  (2 tablets 200mg ) every 5-6 hours for 3-5 days. Keep toe immobilized with shoe and buddy taped until evaluated by orthopedics and/or 4-6 weeks until all tenderness has resolved. Call to make appointment with orthopedics above. Read below information and follow recommendations. Toe Fracture Your caregiver has diagnosed you as having a fractured toe. A toe fracture is a break in the bone of a toe. "Buddy taping" is a way of splinting your broken toe, by taping the broken toe to the toe next to it. This "buddy taping" will keep the injured toe from moving beyond normal range of motion. Buddy taping also helps the toe heal in a more normal alignment. It may take 6 to 8 weeks for the toe injury to heal. HOME CARE INSTRUCTIONS   Leave your toes taped together for as long as directed by your caregiver or until you see a doctor for a follow-up examination. You can change the tape after bathing. Always use a small piece of gauze or cotton between the toes when taping them together. This will help the skin stay dry and prevent infection.  Apply ice to the injury for 15-20 minutes each hour while awake for the first 2 days. Put the ice in a plastic bag and place a towel between the bag of ice and your skin.  After the first 2 days, apply heat to the injured area. Use heat for the next 2 to 3 days. Place a heating pad on the foot or soak the foot in warm water as directed by your caregiver.  Keep your foot elevated as much as possible to lessen swelling.  Wear sturdy, supportive shoes. The shoes should not pinch the toes or fit tightly against the  toes.  Your caregiver may prescribe a rigid shoe if your foot is very swollen.  Your may be given crutches if the pain is too great and it hurts too much to walk.  Only take over-the-counter or prescription medicines for pain, discomfort, or fever as directed by your caregiver.  If your caregiver has given you a follow-up appointment, it is very important to keep that appointment. Not keeping the appointment could result in a chronic or permanent injury, pain, and disability. If there is any problem keeping the appointment, you must call back to this facility for assistance. SEEK MEDICAL CARE IF:   You have increased pain or swelling, not relieved with medications.  The pain does not get better after 1 week.  Your injured toe is cold when the others are warm. SEEK IMMEDIATE MEDICAL CARE IF:   The toe becomes cold, numb, or white.  The toe becomes hot (inflamed) and red. Document Released: 02/06/2000 Document Revised: 05/03/2011 Document Reviewed: 09/25/2007 [facility] Patient Information 2015 [place], [place]. This information is not intended to replace advice given to you by your health care provider. Make sure you discuss any questions you have with your health care provider.

## 2014-06-24 NOTE — ED Notes (Signed)
Pt amb to room 7 with slow, steady gait. Reports slipping on two steps yesterday, her left great toe "went underneath and I heard a crack." left great toe is swollen, contused, and tender, dec rom to toe due to pain. Pt is smiling and laughing with this rn and family during triage. Denies any other injury or c/o.

## 2014-07-17 ENCOUNTER — Ambulatory Visit: Payer: 59 | Admitting: Family Medicine

## 2015-02-19 ENCOUNTER — Emergency Department (HOSPITAL_BASED_OUTPATIENT_CLINIC_OR_DEPARTMENT_OTHER)
Admission: EM | Admit: 2015-02-19 | Discharge: 2015-02-19 | Disposition: A | Discharge status: Home / Self Care | Payer: Self-pay | Attending: Emergency Medicine | Admitting: Emergency Medicine

## 2015-02-19 ENCOUNTER — Emergency Department (HOSPITAL_BASED_OUTPATIENT_CLINIC_OR_DEPARTMENT_OTHER): Payer: Self-pay

## 2015-02-19 ENCOUNTER — Emergency Department (HOSPITAL_BASED_OUTPATIENT_CLINIC_OR_DEPARTMENT_OTHER): Payer: 59

## 2015-02-19 ENCOUNTER — Encounter (HOSPITAL_BASED_OUTPATIENT_CLINIC_OR_DEPARTMENT_OTHER): Payer: Self-pay | Admitting: Emergency Medicine

## 2015-02-19 DIAGNOSIS — R102 Pelvic and perineal pain: Secondary | ICD-10-CM

## 2015-02-19 DIAGNOSIS — R630 Anorexia: Secondary | ICD-10-CM | POA: Insufficient documentation

## 2015-02-19 DIAGNOSIS — Z3202 Encounter for pregnancy test, result negative: Secondary | ICD-10-CM | POA: Insufficient documentation

## 2015-02-19 DIAGNOSIS — N730 Acute parametritis and pelvic cellulitis: Secondary | ICD-10-CM | POA: Insufficient documentation

## 2015-02-19 LAB — CBC WITH DIFFERENTIAL/PLATELET
BASOS PCT: 0 %
Basophils Absolute: 0 10*3/uL (ref 0.0–0.1)
EOS ABS: 0 10*3/uL (ref 0.0–0.7)
Eosinophils Relative: 0 %
HEMATOCRIT: 38.5 % (ref 36.0–46.0)
HEMOGLOBIN: 12.8 g/dL (ref 12.0–15.0)
LYMPHS ABS: 1.2 10*3/uL (ref 0.7–4.0)
Lymphocytes Relative: 8 %
MCH: 27.7 pg (ref 26.0–34.0)
MCHC: 33.2 g/dL (ref 30.0–36.0)
MCV: 83.3 fL (ref 78.0–100.0)
MONO ABS: 1 10*3/uL (ref 0.1–1.0)
MONOS PCT: 7 %
NEUTROS PCT: 85 %
Neutro Abs: 12.8 10*3/uL — ABNORMAL HIGH (ref 1.7–7.7)
Platelets: 256 10*3/uL (ref 150–400)
RBC: 4.62 MIL/uL (ref 3.87–5.11)
RDW: 13 % (ref 11.5–15.5)
WBC: 15.1 10*3/uL — ABNORMAL HIGH (ref 4.0–10.5)

## 2015-02-19 LAB — WET PREP, GENITAL
SPERM: NONE SEEN
Trich, Wet Prep: NONE SEEN
YEAST WET PREP: NONE SEEN

## 2015-02-19 LAB — BASIC METABOLIC PANEL
Anion gap: 5 (ref 5–15)
BUN: 7 mg/dL (ref 6–20)
CALCIUM: 9 mg/dL (ref 8.9–10.3)
CHLORIDE: 106 mmol/L (ref 101–111)
CO2: 24 mmol/L (ref 22–32)
CREATININE: 0.59 mg/dL (ref 0.44–1.00)
GFR calc non Af Amer: >60 mL/min (ref >60)
Glucose, Bld: 101 mg/dL — ABNORMAL HIGH (ref 65–99)
Potassium: 3.6 mmol/L (ref 3.5–5.1)
SODIUM: 135 mmol/L (ref 135–145)

## 2015-02-19 LAB — LIPASE, BLOOD: Lipase: 19 U/L (ref 11–51)

## 2015-02-19 LAB — URINALYSIS, ROUTINE W REFLEX MICROSCOPIC
BILIRUBIN URINE: NEGATIVE
GLUCOSE, UA: NEGATIVE mg/dL
Hgb urine dipstick: NEGATIVE
KETONES UR: NEGATIVE mg/dL
Leukocytes, UA: NEGATIVE
Nitrite: NEGATIVE
PH: 8 (ref 5.0–8.0)
Protein, ur: NEGATIVE mg/dL
SPECIFIC GRAVITY, URINE: 1.012 (ref 1.005–1.030)

## 2015-02-19 LAB — PREGNANCY, URINE: Preg Test, Ur: NEGATIVE

## 2015-02-19 MED ORDER — SODIUM CHLORIDE 0.9 % IV BOLUS (SEPSIS)
1000.0000 mL | Freq: Once | INTRAVENOUS | Status: AC
  Administered 2015-02-19: 1000 mL via INTRAVENOUS

## 2015-02-19 MED ORDER — IOHEXOL 300 MG/ML  SOLN
25.0000 mL | Freq: Once | INTRAMUSCULAR | Status: AC | PRN
  Administered 2015-02-19: 25 mL via ORAL

## 2015-02-19 MED ORDER — ONDANSETRON HCL 4 MG/2ML IJ SOLN
4.0000 mg | Freq: Once | INTRAMUSCULAR | Status: AC
  Administered 2015-02-19: 4 mg via INTRAVENOUS
  Filled 2015-02-19: qty 2

## 2015-02-19 MED ORDER — DOXYCYCLINE HYCLATE 100 MG PO TABS
100.0000 mg | ORAL_TABLET | Freq: Once | ORAL | Status: AC
  Administered 2015-02-19: 100 mg via ORAL
  Filled 2015-02-19: qty 1

## 2015-02-19 MED ORDER — DOXYCYCLINE HYCLATE 100 MG PO CAPS: 100.0000 mg | ORAL_CAPSULE | Freq: Two times a day (BID) | ORAL | Status: DC

## 2015-02-19 MED ORDER — IOHEXOL 300 MG/ML  SOLN
100.0000 mL | Freq: Once | INTRAMUSCULAR | Status: AC | PRN
  Administered 2015-02-19: 100 mL via INTRAVENOUS

## 2015-02-19 MED ORDER — ONDANSETRON HCL 4 MG PO TABS: 4.0000 mg | ORAL_TABLET | Freq: Four times a day (QID) | ORAL | Status: DC

## 2015-02-19 MED ORDER — OXYCODONE-ACETAMINOPHEN 5-325 MG PO TABS: 1.0000 | ORAL_TABLET | Freq: Four times a day (QID) | ORAL | Status: DC | PRN

## 2015-02-19 MED ORDER — CEFTRIAXONE SODIUM 250 MG IJ SOLR
250.0000 mg | Freq: Once | INTRAMUSCULAR | Status: AC
  Administered 2015-02-19: 250 mg via INTRAMUSCULAR
  Filled 2015-02-19: qty 250

## 2015-02-19 MED ORDER — DIPHENHYDRAMINE HCL 50 MG/ML IJ SOLN
25.0000 mg | Freq: Once | INTRAMUSCULAR | Status: AC
  Administered 2015-02-19: 25 mg via INTRAVENOUS
  Filled 2015-02-19: qty 1

## 2015-02-19 MED ORDER — MORPHINE SULFATE (PF) 4 MG/ML IV SOLN
4.0000 mg | Freq: Once | INTRAVENOUS | Status: DC
  Filled 2015-02-19: qty 1

## 2015-02-19 NOTE — ED Notes (Signed)
Patient returns from ultrasound.

## 2015-02-19 NOTE — ED Notes (Signed)
Patient transported to ultrasound via stretcher.

## 2015-02-19 NOTE — ED Notes (Signed)
Pt reports onset of abdominal pain Saturday with vomiting x 1, pain continued thoughout week and today increased

## 2015-02-19 NOTE — Discharge Instructions (Signed)
Return to the ED with any concerns including vomiting and not able to keep down liquids or antibiotics, worsening abdominal pain, fever/chills, decreased level of alertness/lethargy, or any other alarming symptoms °

## 2015-02-19 NOTE — ED Provider Notes (Signed)
CSN: [id number]     Arrival date & time 02/19/15  [id number] History   First MD Initiated Contact with Patient 02/19/15 [phone]     Chief Complaint  Patient presents with  . Abdominal Pain     (Consider location/radiation/quality/duration/timing/severity/associated sxs/prior Treatment) HPI  Pt presenting with c/o abdominal pain.  She states pain started 3 days ago in epigastric region- she thought it was indigestion and she took tums.  Yesterday and today pain has been in lower abdomen.  She had vomiting associated x 1 3 days ago.  No diarrhea or change in stools.  Denies dysuria, urgency or frequncy.  No vaginal discharge or vaginal bleeding.  She has had decreased appetite.  States symptoms are not made better or worse by eating.  There are no other associated systemic symptoms, there are no other alleviating or modifying factors.   History reviewed. No pertinent past medical history. Past Surgical History  Procedure Laterality Date  . Breast surgery      right lumpectomy  . Augmentation mammaplasty  2014    silicone-    Family History  Problem Relation Age of Onset  . Hypertension Father   . Heart disease Father   . Cancer Maternal Aunt     ?- cervical or uterine  . Cancer Maternal Uncle     leukemia  . Diabetes Paternal Grandmother   . Stroke Sister    Social History  Substance Use Topics  . Smoking status: Never Smoker   . Smokeless tobacco: None  . Alcohol Use: Yes     Comment: socially   OB History    Gravida Para Term Preterm AB TAB SAB Ectopic Multiple Living   2 2        2      Review of Systems  ROS reviewed and all otherwise negative except for mentioned in HPI    Allergies  Contrast media  Home Medications   Prior to Admission medications   Medication Sig Start Date End Date Taking? Authorizing Provider  aspirin-sod bicarb-citric acid (ALKA-SELTZER) 325 MG TBEF tablet Take 325 mg by mouth every 6 (six) hours as needed.   Yes Historical Provider, MD   ibuprofen (ADVIL,MOTRIN) 200 MG tablet Take 200 mg by mouth every 6 (six) hours as needed.   Yes Historical Provider, MD  doxycycline (VIBRAMYCIN) 100 MG capsule Take 1 capsule (100 mg total) by mouth 2 (two) times daily. 02/19/15   Jerelyn ScottMartha Linker, MD  ondansetron (ZOFRAN) 4 MG tablet Take 1 tablet (4 mg total) by mouth every 6 (six) hours. 02/19/15   Jerelyn ScottMartha Linker, MD  oxyCODONE-acetaminophen (PERCOCET/ROXICET) 5-325 MG tablet Take 1-2 tablets by mouth every 6 (six) hours as needed for severe pain. 02/19/15   Jerelyn ScottMartha Linker, MD   BP 119/83 mmHg  Pulse 108  Temp(Src) 98.1 F (36.7 C) (Oral)  Resp 16  Ht 5' 6.5" (1.689 m)  Wt 58.968 kg  BMI 20.67 kg/m2  SpO2 100%  LMP 02/06/2015  Vitals reviewed Physical Exam  Physical Examination: General appearance - alert, well appearing, and in no distress Mental status - alert, oriented to person, place, and time Mouth - mucous membranes moist, pharynx normal without lesions Chest - clear to auscultation, no wheezes, rales or rhonchi, symmetric air entry Heart - normal rate, regular rhythm, normal S1, S2, no murmurs, rubs, clicks or gallops Abdomen - soft, tenderness to palpation in suprapubic and bilateral lower abdomen, nabs, no gaurding or rebound tenderness, nondistended, no masses or organomegaly Pelvic - normal external  genitalia, vulva, vagina, cervix, uterus and adnexa, + CMT, bilateral adnexal tenderness, + pus coming from cervical os Neurological - alert, oriented, normal speech Extremities - peripheral pulses normal, no pedal edema, no clubbing or cyanosis Skin - normal coloration and turgor, no rashes  ED Course  Procedures (including critical care time) Labs Review Labs Reviewed  WET PREP, GENITAL - Abnormal; Notable for the following:    Clue Cells Wet Prep HPF POC PRESENT (*)    WBC, Wet Prep HPF POC MANY (*)    All other components within normal limits  CBC WITH DIFFERENTIAL/PLATELET - Abnormal; Notable for the following:     WBC 15.1 (*)    Neutro Abs 12.8 (*)    All other components within normal limits  BASIC METABOLIC PANEL - Abnormal; Notable for the following:    Glucose, Bld 101 (*)    All other components within normal limits  URINALYSIS, ROUTINE W REFLEX MICROSCOPIC (NOT AT [facility])  LIPASE, BLOOD  HIV ANTIBODY (ROUTINE TESTING)  PREGNANCY, URINE  GC/CHLAMYDIA PROBE AMP ([place]) NOT AT [facility]    Imaging Review US Transvaginal Non-ob  02/19/2015  CLINICAL DATA:  Generalized pelvic pain for 2 days, right greater than left. EXAM: TRANSABDOMINAL AND TRANSVAGINAL ULTRASOUND OF PELVIS TECHNIQUE: Both transabdominal and transvaginal ultrasound examinations of the pelvis were performed. Transabdominal technique was performed for global imaging of the pelvis including uterus, ovaries, adnexal regions, and pelvic cul-de-sac. It was necessary to proceed with endovaginal exam following the transabdominal exam to visualize the uterus, endometrium, ovaries and adnexa . COMPARISON:  CT earlier today. FINDINGS: Uterus Measurements: 9.5 x 3.4 x 5.3 cm. No fibroids or other mass visualized. Endometrium Thickness: 9 mm in thickness.  No focal abnormality visualized. Right ovary Measurements: 3.3 x 2.0 x 2.2 cm. Normal appearance/no adnexal mass. Left ovary Measurements: 3.2 x 2.5 x 2.7 cm. 1.4 cm follicle. No adnexal or ovarian masses. Other findings Small amount of free fluid in the pelvis, likely physiologic. IMPRESSION: No acute or significant abnormality. Electronically Signed   By: Charlett Nose M.D.   On: 02/19/2015 14:13   US Pelvis Complete  02/19/2015  CLINICAL DATA:  Generalized pelvic pain for 2 days, right greater than left. EXAM: TRANSABDOMINAL AND TRANSVAGINAL ULTRASOUND OF PELVIS TECHNIQUE: Both transabdominal and transvaginal ultrasound examinations of the pelvis were performed. Transabdominal technique was performed for global imaging of the pelvis including uterus, ovaries, adnexal regions, and pelvic  cul-de-sac. It was necessary to proceed with endovaginal exam following the transabdominal exam to visualize the uterus, endometrium, ovaries and adnexa . COMPARISON:  CT earlier today. FINDINGS: Uterus Measurements: 9.5 x 3.4 x 5.3 cm. No fibroids or other mass visualized. Endometrium Thickness: 9 mm in thickness.  No focal abnormality visualized. Right ovary Measurements: 3.3 x 2.0 x 2.2 cm. Normal appearance/no adnexal mass. Left ovary Measurements: 3.2 x 2.5 x 2.7 cm. 1.4 cm follicle. No adnexal or ovarian masses. Other findings Small amount of free fluid in the pelvis, likely physiologic. IMPRESSION: No acute or significant abnormality. Electronically Signed   By: Charlett Nose M.D.   On: 02/19/2015 14:13   Ct Abdomen Pelvis W Contrast  02/19/2015  CLINICAL DATA:  Lower abdominal pain since 02/15/2015. Recent increase in pain. EXAM: CT ABDOMEN AND PELVIS WITH CONTRAST TECHNIQUE: Multidetector CT imaging of the abdomen and pelvis was performed using the standard protocol following bolus administration of intravenous contrast. CONTRAST:  25mL OMNIPAQUE IOHEXOL 300 MG/ML SOLN, OMNIPAQUE IOHEXOL 300 MG/ML SOLN COMPARISON:  None.  FINDINGS: Lower chest:  No acute findings.  BILATERAL breast implants. Hepatobiliary: No masses or other significant abnormality. Pancreas: No mass, inflammatory changes, or other significant abnormality. Spleen: Within normal limits in size and appearance. Adrenals/Urinary Tract: No masses identified. No evidence of hydronephrosis. Stomach/Bowel: No evidence of obstruction, inflammatory process, or abnormal fluid collections. Nonvisualized appendix, but no RIGHT lower quadrant inflammatory change. Vascular/Lymphatic: No pathologically enlarged lymph nodes. No evidence of abdominal aortic aneurysm. Reproductive: No mass or other significant abnormality. Probable corpus luteum cyst LEFT ovary. Other: Small amount of pelvic free fluid likely physiologic or related to ruptured corpus  luteum cyst. Musculoskeletal:  No suspicious bone lesions identified. IMPRESSION: Small amount of pelvic free fluid. Significance uncertain, but likely physiologic/related to corpus luteum cyst. If clinically indicated, transabdominal and transvaginal ultrasound could be helpful in further evaluation. Otherwise negative study. Electronically Signed   By: Elsie Stain M.D.   On: 02/19/2015 13:02   I have personally reviewed and evaluated these images and lab results as part of my medical decision-making.   EKG Interpretation None      MDM   Final diagnoses:  PID (acute pelvic inflammatory disease)    Pt presenting with c/o lower abdominal pain and emesis x 1.  Labs reveal leukocytosis.  Pelvic exam is c/w PID with pus at cervical os, + CMT and adnexal tendernss.  Wet prep with many WBCs. Pt denies sexual activity for the past year.  Abdominal CT scan shows some pelvic free fluid and corpus luteum cyst. Pelvic ultrasound obtained as well- this was reassuring.  Pt treated for PID.  Pt given IV fluids as well.  Home on doxycycline, pain and nausea meds.  Advised close followup as an outpatient.  Discharged with strict return precautions.  Pt agreeable with plan.      Jerelyn Scott, MD 02/20/15 [phone]

## 2015-02-19 NOTE — ED Notes (Signed)
Pt returned from CT c/o redness and irritation to eyes and nasal/sinus congestion onset immediately after admin of CT contrast. Breath sounds clear, breathing even and unlabored, denies SOB or feelings of throat tightness or tongue swelling. Erythema noted to sclera and swelling noted to bilateral lower eye area. NAD. Dr Karma GanjaLinker notified.

## 2015-02-20 LAB — HIV ANTIBODY (ROUTINE TESTING W REFLEX): HIV SCREEN 4TH GENERATION: NONREACTIVE

## 2015-02-22 LAB — GC/CHLAMYDIA PROBE AMP (~~LOC~~) NOT AT ARMC
CHLAMYDIA, DNA PROBE: NEGATIVE
NEISSERIA GONORRHEA: NEGATIVE

## 2016-07-07 ENCOUNTER — Encounter: Payer: Self-pay | Admitting: Gynecology

## 2016-12-06 IMAGING — US US TRANSVAGINAL NON-OB
1 series · 14 of 25 positions shown · non-contrast
Comparison: CT earlier today.

CLINICAL DATA: Generalized pelvic pain for 2 days, right greater
than left.

EXAM:
TRANSABDOMINAL AND TRANSVAGINAL ULTRASOUND OF PELVIS
TECHNIQUE: Both transabdominal and transvaginal ultrasound examinations of the
pelvis were performed. Transabdominal technique was performed for
global imaging of the pelvis including uterus, ovaries, adnexal
regions, and pelvic cul-de-sac. It was necessary to proceed with
endovaginal exam following the transabdominal exam to visualize the
uterus, endometrium, ovaries and adnexa .

[Series 1: us transvaginal non-ob · 0.24mm/px · 93 acquisitions, 14 frames shown]
[im 1/93]
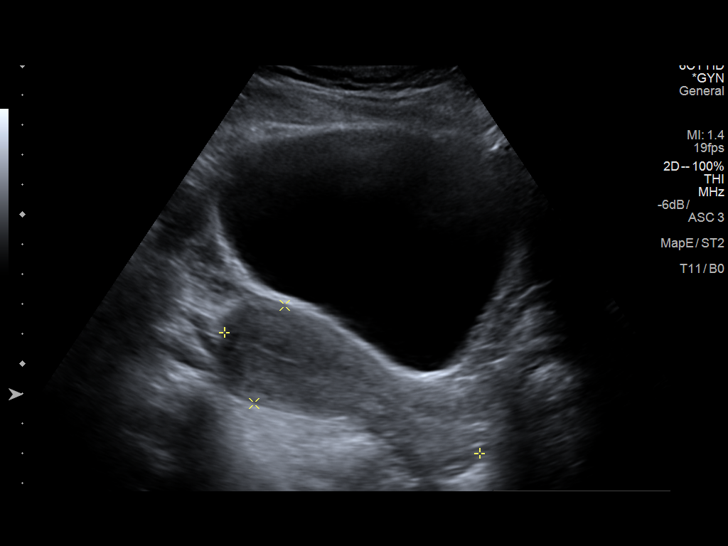
[im 8/93]
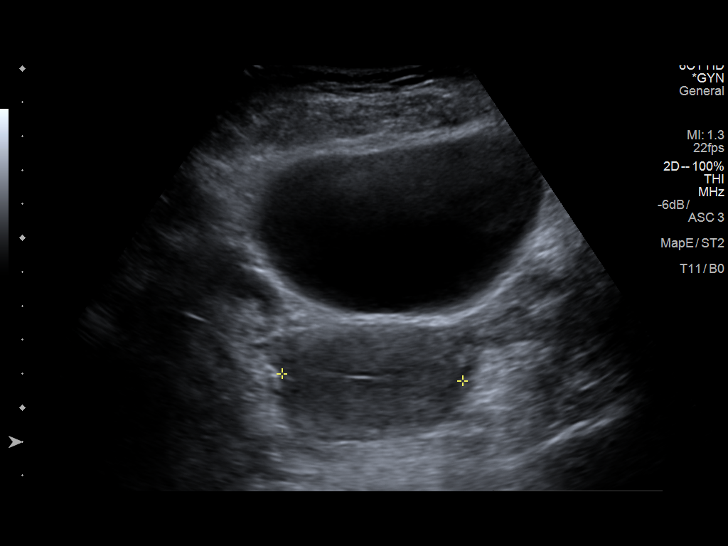
[im 16/93]
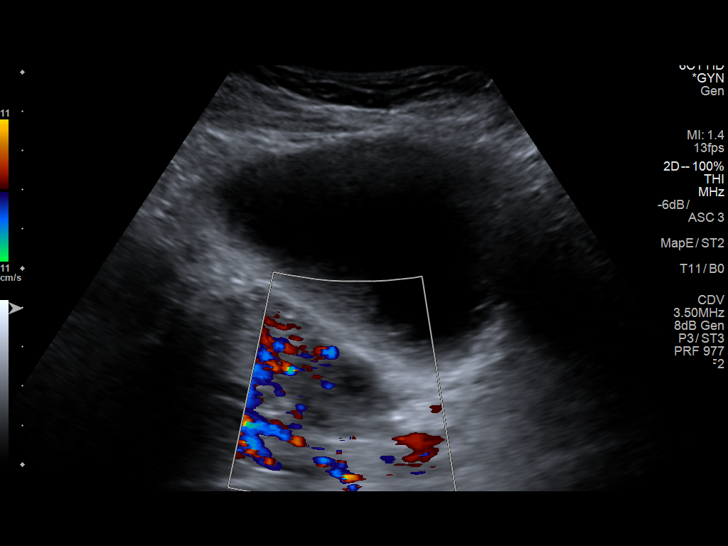
[im 24/93]
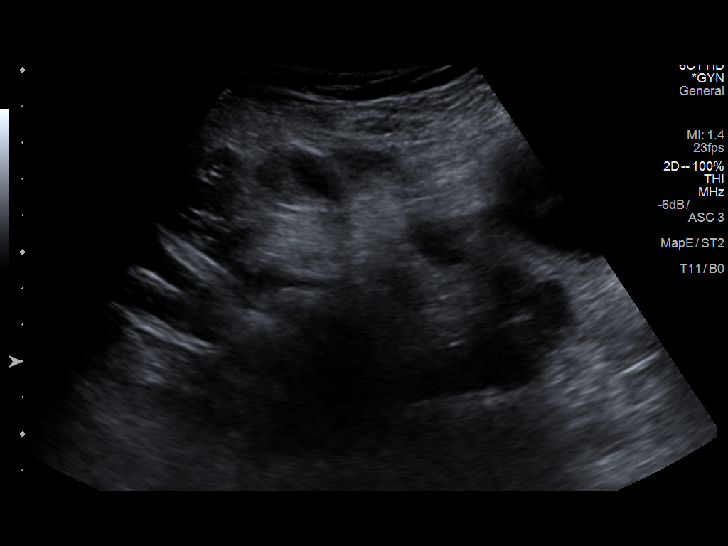
[im 31/93]
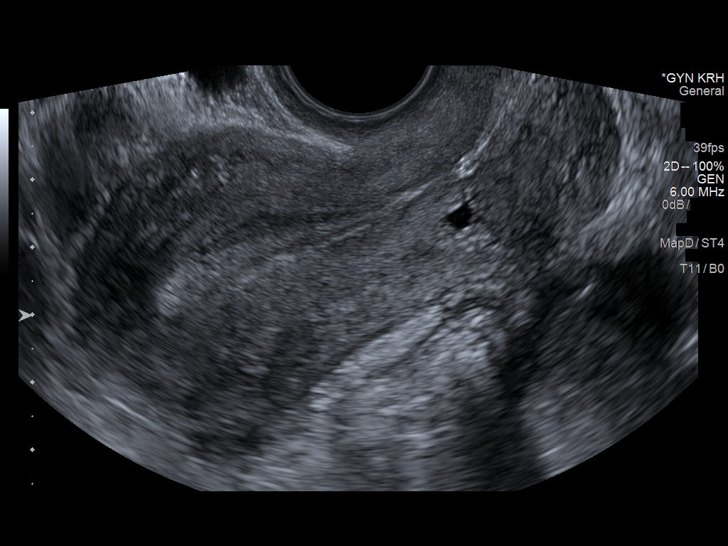
[im 35/93]
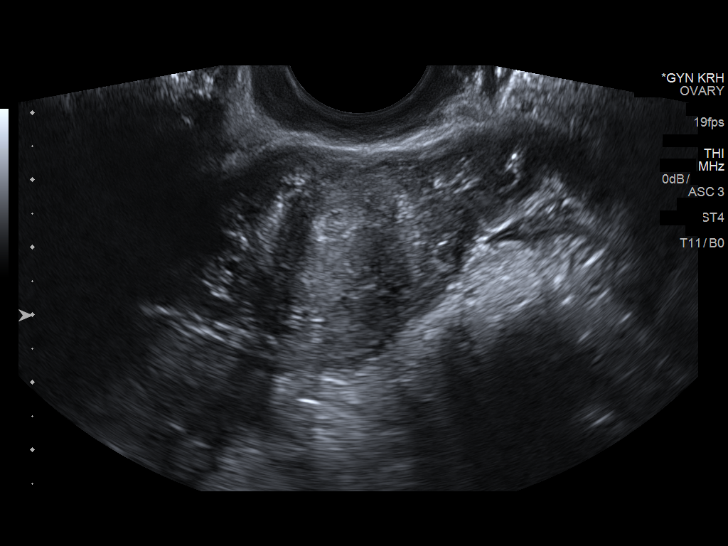
[im 43/93]
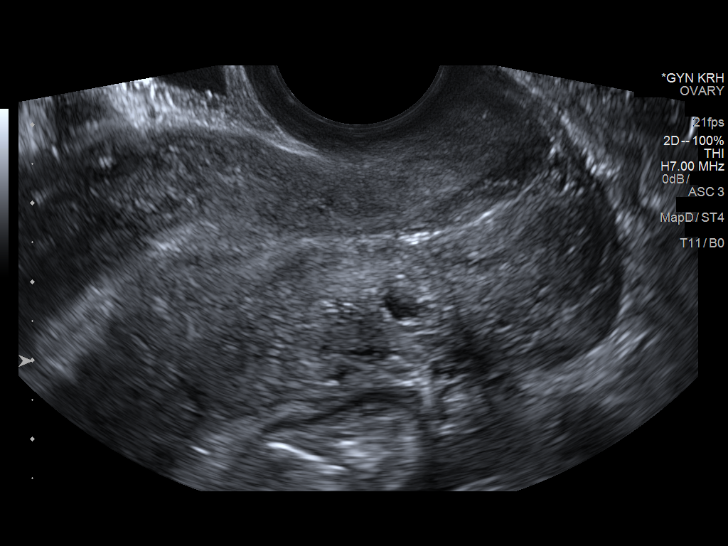
[im 50/93]
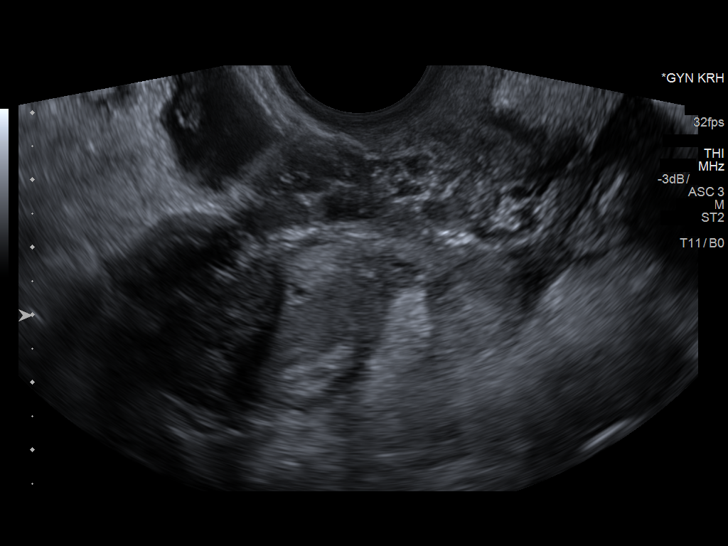
[im 58/93]
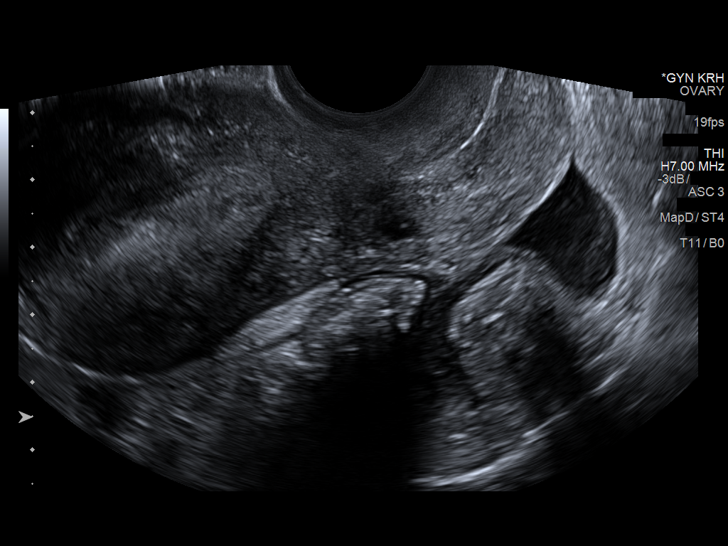
[im 62/93]
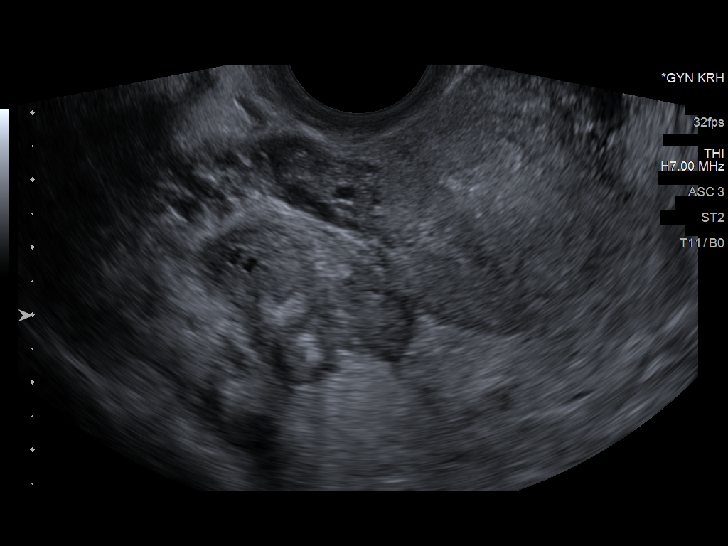
[im 70/93]
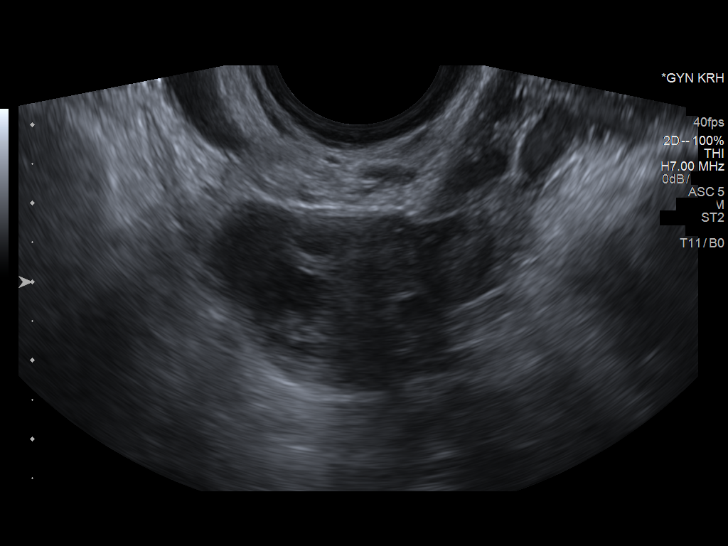
[im 77/93]
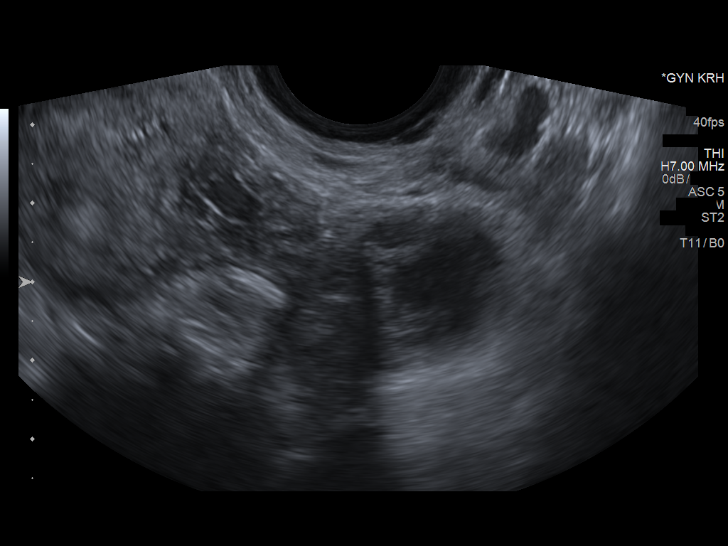
[im 85/93]
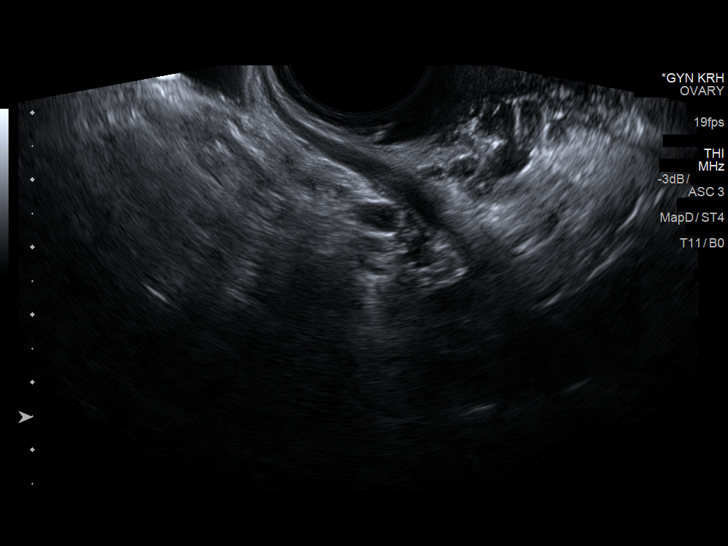
[im 93/93]
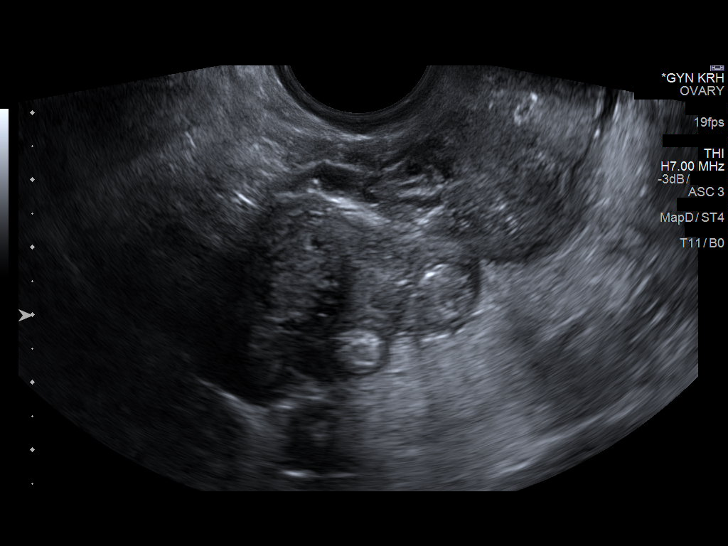

[14 of 25 positions shown; findings below may reference images not displayed]

FINDINGS: Uterus

Measurements: 9.5 x 3.4 x 5.3 cm. No fibroids or other mass
visualized.

Endometrium

Thickness: 9 mm in thickness.  No focal abnormality visualized.

Right ovary

Measurements: 3.3 x 2.0 x 2.2 cm. Normal appearance/no adnexal mass.

Left ovary

Measurements: 3.2 x 2.5 x 2.7 cm. 1.4 cm follicle. No adnexal or
ovarian masses.

Other findings

Small amount of free fluid in the pelvis, likely physiologic.
IMPRESSION: No acute or significant abnormality.

## 2017-01-15 IMAGING — CT CT ABD-PELV W/ CM
2 of 4 series · 16 of 46 positions shown, 18 images · IV contrast (omnipaque)
Comparison: None.

CLINICAL DATA: Lower abdominal pain since 02/15/2015. Recent
increase in pain.

EXAM:
CT ABDOMEN AND PELVIS WITH CONTRAST
TECHNIQUE: Multidetector CT imaging of the abdomen and pelvis was performed
using the standard protocol following bolus administration of
intravenous contrast.
CONTRAST:  25mL OMNIPAQUE IOHEXOL 300 MG/ML SOLN, 100mL OMNIPAQUE
IOHEXOL 300 MG/ML SOLN

[Series 2: abd/ pelvis · axial · 0.71mm/px · z∈[-477,-67]mm · 13 of 90 slices shown, 15 images]
[im 4/90  soft-tissue]
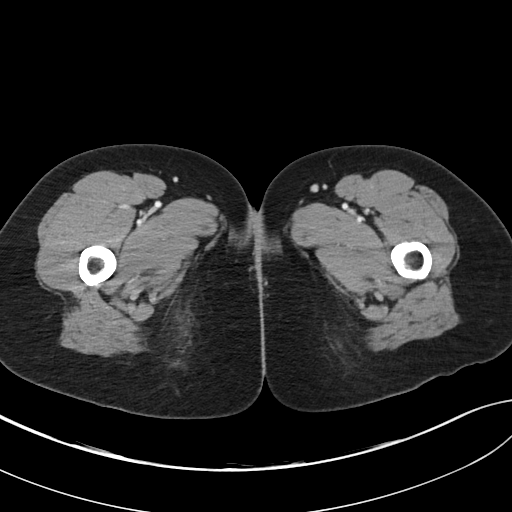
[im 4/90  bone]
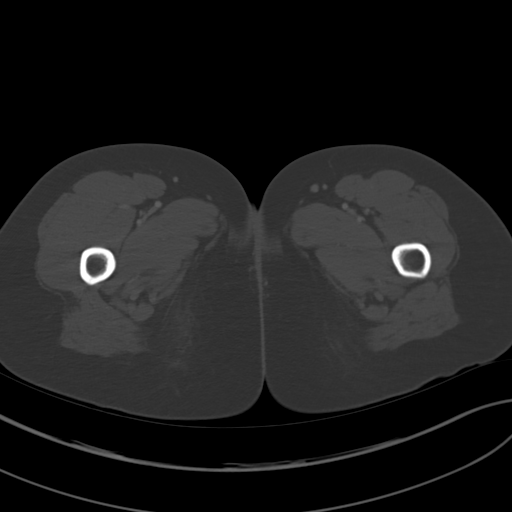
[im 12/90  soft-tissue]
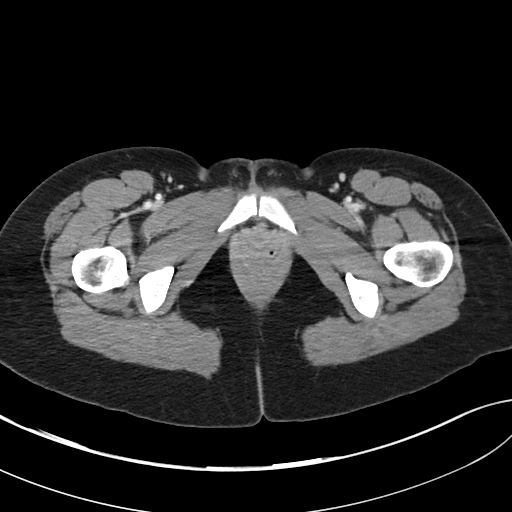
[im 19/90  soft-tissue]
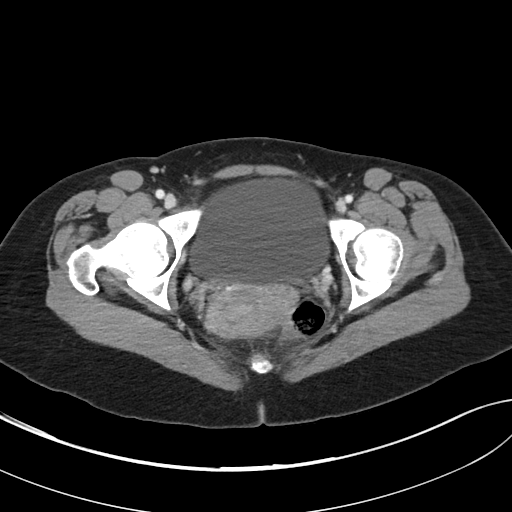
[im 26/90  soft-tissue]
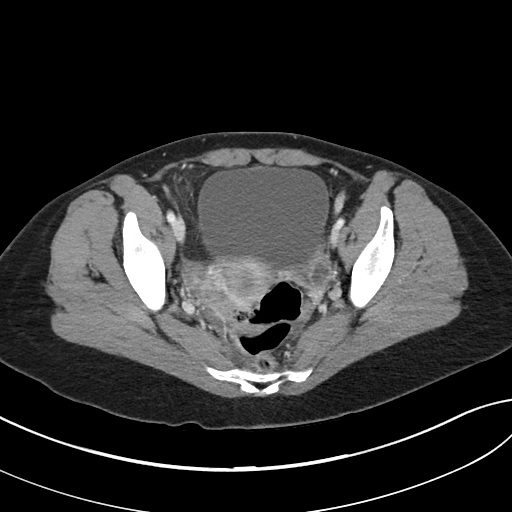
[im 30/90  soft-tissue]
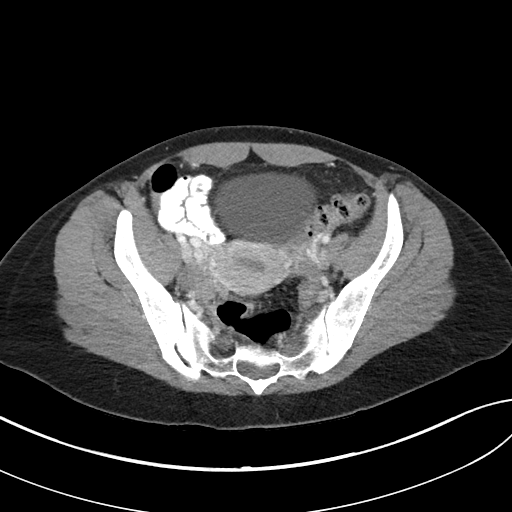
[im 38/90  soft-tissue]
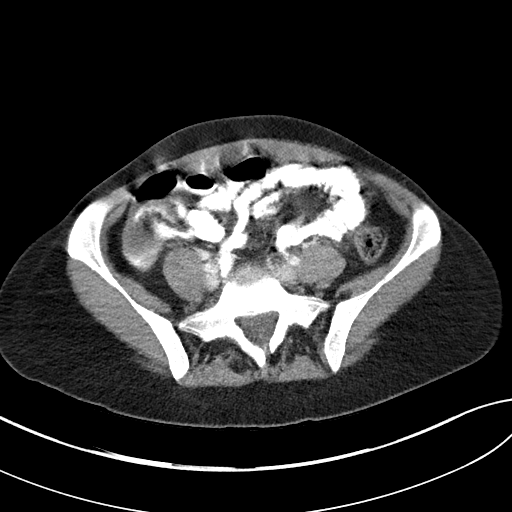
[im 45/90  soft-tissue]
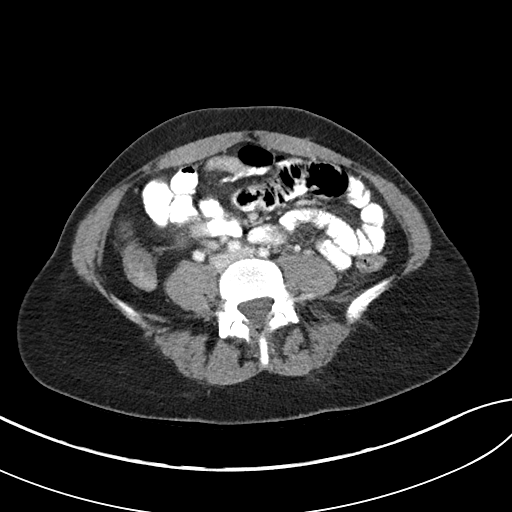
[im 52/90  soft-tissue]
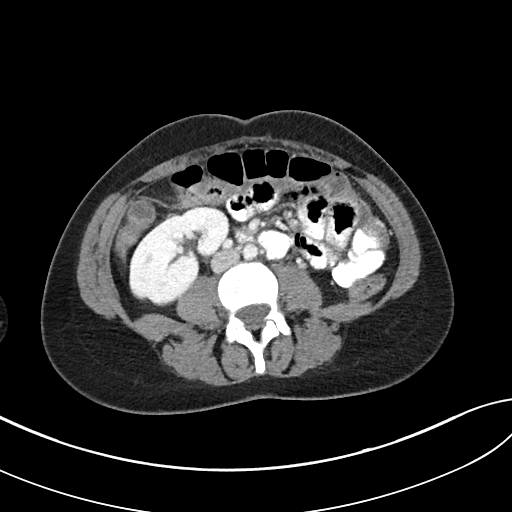
[im 60/90  soft-tissue]
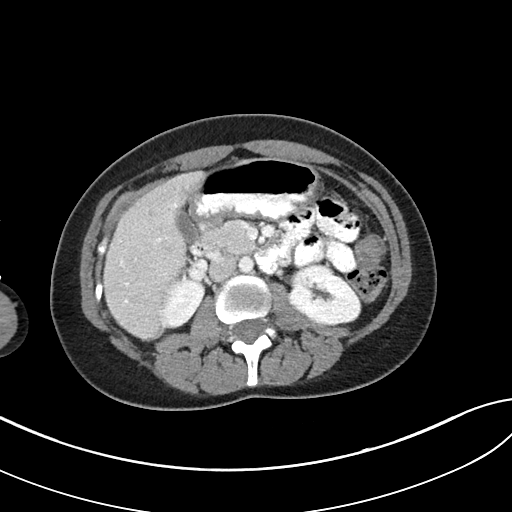
[im 60/90  bone]
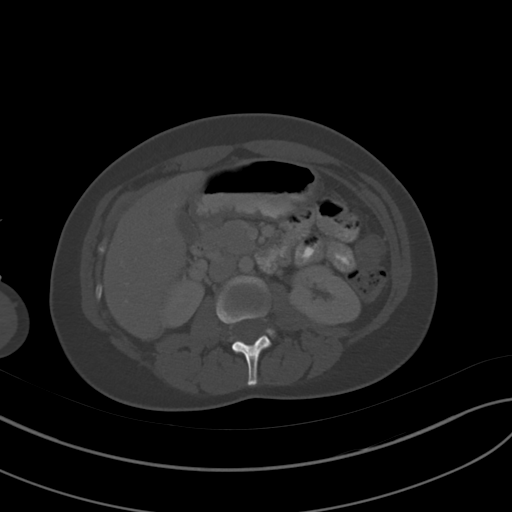
[im 64/90  soft-tissue]
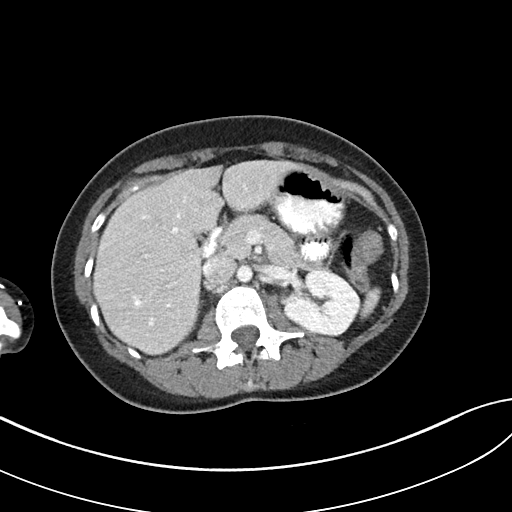
[im 71/90  soft-tissue]
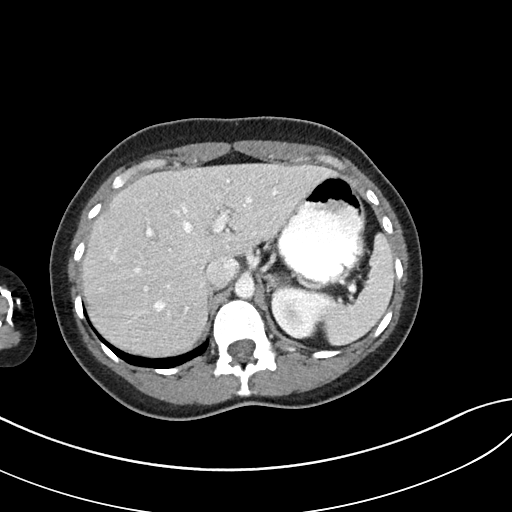
[im 78/90  soft-tissue]
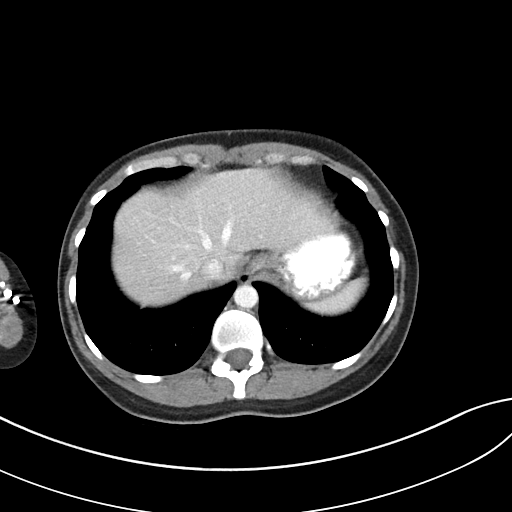
[im 86/90  soft-tissue]
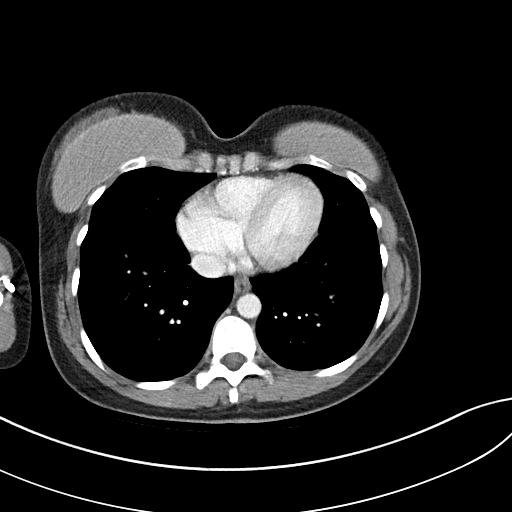

[Series 5: coronal soft tissue · coronal · 0.68mm/px · 3 of 76 slices shown]
[im 26/76  soft-tissue]
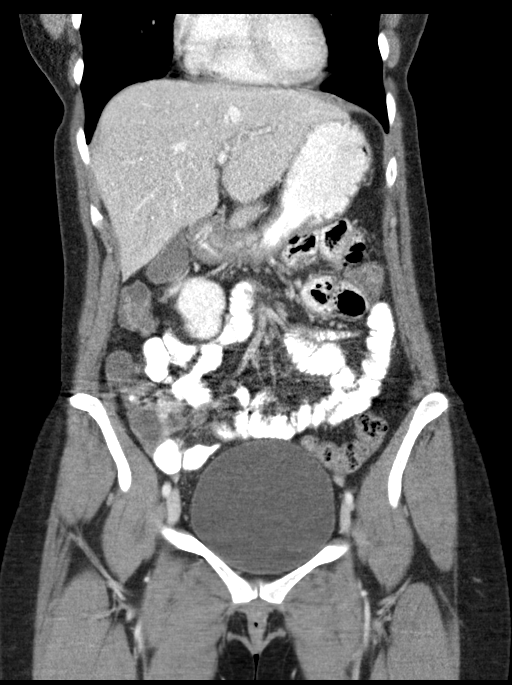
[im 34/76  soft-tissue]
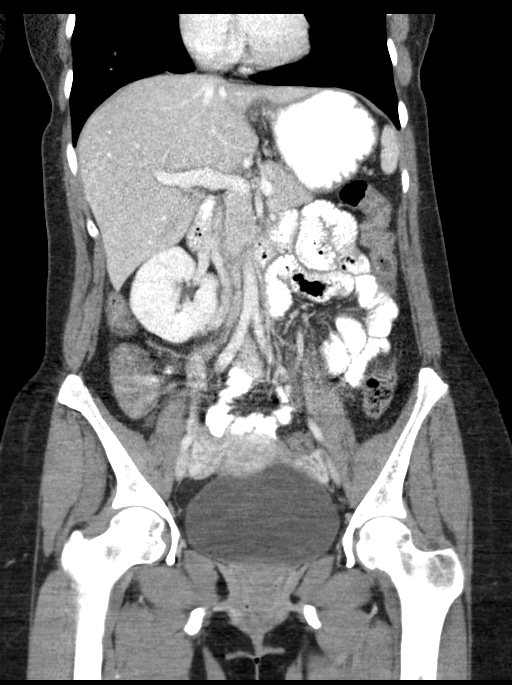
[im 42/76  soft-tissue]
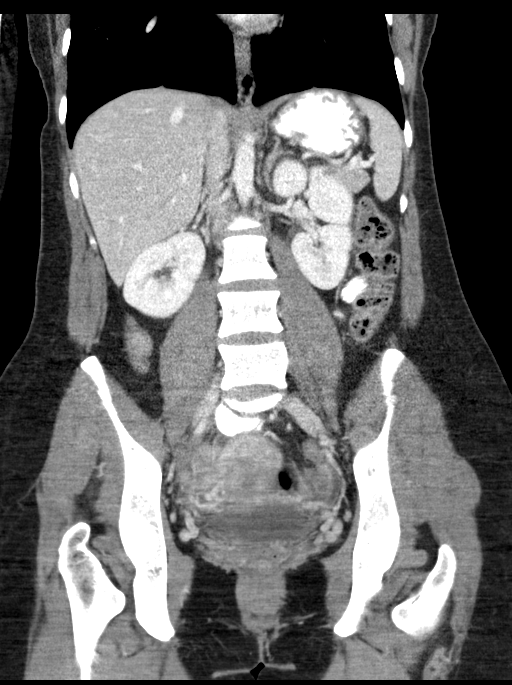

[16 of 46 positions shown; findings below may reference images not displayed]

FINDINGS: Lower chest:  No acute findings.  BILATERAL breast implants.

Hepatobiliary: No masses or other significant abnormality.

Pancreas: No mass, inflammatory changes, or other significant
abnormality.

Spleen: Within normal limits in size and appearance.

Adrenals/Urinary Tract: No masses identified. No evidence of
hydronephrosis.

Stomach/Bowel: No evidence of obstruction, inflammatory process, or
abnormal fluid collections. Nonvisualized appendix, but no RIGHT
lower quadrant inflammatory change.

Vascular/Lymphatic: No pathologically enlarged lymph nodes. No
evidence of abdominal aortic aneurysm.

Reproductive: No mass or other significant abnormality. Probable
corpus luteum cyst LEFT ovary.

Other: Small amount of pelvic free fluid likely physiologic or
related to ruptured corpus luteum cyst.

Musculoskeletal:  No suspicious bone lesions identified.
IMPRESSION: Small amount of pelvic free fluid. Significance uncertain, but
likely physiologic/related to corpus luteum cyst. If clinically
indicated, transabdominal and transvaginal ultrasound could be
helpful in further evaluation.

Otherwise negative study.

## 2019-02-01 ENCOUNTER — Other Ambulatory Visit: Payer: Self-pay

## 2019-02-01 DIAGNOSIS — Z20822 Contact with and (suspected) exposure to covid-19: Secondary | ICD-10-CM

## 2019-02-03 LAB — NOVEL CORONAVIRUS, NAA: SARS-CoV-2, NAA: DETECTED — AB

## 2019-02-08 ENCOUNTER — Encounter (HOSPITAL_BASED_OUTPATIENT_CLINIC_OR_DEPARTMENT_OTHER): Payer: Self-pay | Admitting: Student

## 2019-02-08 ENCOUNTER — Emergency Department (HOSPITAL_BASED_OUTPATIENT_CLINIC_OR_DEPARTMENT_OTHER): Payer: Self-pay

## 2019-02-08 ENCOUNTER — Emergency Department (HOSPITAL_BASED_OUTPATIENT_CLINIC_OR_DEPARTMENT_OTHER)
Admission: EM | Admit: 2019-02-08 | Discharge: 2019-02-08 | Disposition: A | Discharge status: Home / Self Care | Payer: Self-pay | Attending: Emergency Medicine | Admitting: Emergency Medicine

## 2019-02-08 ENCOUNTER — Other Ambulatory Visit: Payer: Self-pay

## 2019-02-08 DIAGNOSIS — U071 COVID-19: Secondary | ICD-10-CM | POA: Insufficient documentation

## 2019-02-08 DIAGNOSIS — R0789 Other chest pain: Secondary | ICD-10-CM | POA: Insufficient documentation

## 2019-02-08 DIAGNOSIS — R0602 Shortness of breath: Secondary | ICD-10-CM

## 2019-02-08 DIAGNOSIS — Z91041 Radiographic dye allergy status: Secondary | ICD-10-CM | POA: Insufficient documentation

## 2019-02-08 LAB — CBC WITH DIFFERENTIAL/PLATELET
Abs Immature Granulocytes: 0.01 10*3/uL (ref 0.00–0.07)
Basophils Absolute: 0 10*3/uL (ref 0.0–0.1)
Basophils Relative: 0 %
Eosinophils Absolute: 0.1 10*3/uL (ref 0.0–0.5)
Eosinophils Relative: 1 %
HCT: 44 % (ref 36.0–46.0)
Hemoglobin: 14.3 g/dL (ref 12.0–15.0)
Immature Granulocytes: 0 %
Lymphocytes Relative: 25 %
Lymphs Abs: 1.6 10*3/uL (ref 0.7–4.0)
MCH: 27.4 pg (ref 26.0–34.0)
MCHC: 32.5 g/dL (ref 30.0–36.0)
MCV: 84.5 fL (ref 80.0–100.0)
Monocytes Absolute: 0.5 10*3/uL (ref 0.1–1.0)
Monocytes Relative: 8 %
Neutro Abs: 4.1 10*3/uL (ref 1.7–7.7)
Neutrophils Relative %: 66 %
Platelets: 236 10*3/uL (ref 150–400)
RBC: 5.21 MIL/uL — ABNORMAL HIGH (ref 3.87–5.11)
RDW: 12.3 % (ref 11.5–15.5)
WBC: 6.3 10*3/uL (ref 4.0–10.5)
nRBC: 0 % (ref 0.0–0.2)

## 2019-02-08 LAB — COMPREHENSIVE METABOLIC PANEL
ALT: 30 U/L (ref 0–44)
AST: 27 U/L (ref 15–41)
Albumin: 4.1 g/dL (ref 3.5–5.0)
Alkaline Phosphatase: 56 U/L (ref 38–126)
Anion gap: 10 (ref 5–15)
BUN: 8 mg/dL (ref 6–20)
CO2: 24 mmol/L (ref 22–32)
Calcium: 9.2 mg/dL (ref 8.9–10.3)
Chloride: 105 mmol/L (ref 98–111)
Creatinine, Ser: 0.61 mg/dL (ref 0.44–1.00)
GFR calc Af Amer: >60 mL/min (ref >60)
GFR calc non Af Amer: >60 mL/min (ref >60)
Glucose, Bld: 95 mg/dL (ref 70–99)
Potassium: 3.5 mmol/L (ref 3.5–5.1)
Sodium: 139 mmol/L (ref 135–145)
Total Bilirubin: 0.5 mg/dL (ref 0.3–1.2)
Total Protein: 7.7 g/dL (ref 6.5–8.1)

## 2019-02-08 LAB — PREGNANCY, URINE: Preg Test, Ur: NEGATIVE

## 2019-02-08 LAB — TROPONIN I (HIGH SENSITIVITY): Troponin I (High Sensitivity): <2 ng/L (ref <18)

## 2019-02-08 MED ORDER — HYDROXYZINE HCL 10 MG PO TABS: 10.0000 mg | ORAL_TABLET | Freq: Three times a day (TID) | ORAL | 0 refills | Status: DC | PRN

## 2019-02-08 MED ORDER — AEROCHAMBER PLUS FLO-VU MEDIUM MISC
1.0000 | Freq: Once | Status: AC
  Administered 2019-02-08: 1
  Filled 2019-02-08: qty 1

## 2019-02-08 MED ORDER — ALBUTEROL SULFATE HFA 108 (90 BASE) MCG/ACT IN AERS
2.0000 | INHALATION_SPRAY | Freq: Once | RESPIRATORY_TRACT | Status: AC
  Administered 2019-02-08: 16:00:00 2 via RESPIRATORY_TRACT
  Filled 2019-02-08: qty 6.7

## 2019-02-08 MED FILL — hydrOXYzine HCL 10 MG TABS: 10 | 4 days supply | Qty: 12 | Fill #0

## 2019-02-08 NOTE — Discharge Instructions (Addendum)
You were seen in the emergency department today for shortness of breath.  Your work-up was overall reassuring.  Your labs were normal.  Your x-ray was normal.  Your EKG did not show findings of a heart attack.  We are sending you home with an inhaler to use 1 to 2 puffs every 4-6 hours as needed for trouble breathing/wheezing.  We are also sending you home with hydroxyzine to take every 8 hours as needed for anxiety.  We have prescribed you new medication(s) today. Discuss the medications prescribed today with your pharmacist as they can have adverse effects and interactions with your other medicines including over the counter and prescribed medications. Seek medical evaluation if you start to experience new or abnormal symptoms after taking one of these medicines, seek care immediately if you start to experience difficulty breathing, feeling of your throat closing, facial swelling, or rash as these could be indications of a more serious allergic reaction  Please follow-up with your primary care provider within 3 days.  Please call to make them aware of your symptoms and your known COVID-19 diagnosis as they may be doing virtual visits.  Return to the emergency department for new or worsening symptoms including but not limited to increasing shortness of breath, worsening chest pain, different chest pain, coughing up blood, passing out, or any other concerns.      Person Under Monitoring Name: Evelyn Beck  Location: [address] [place] [address]   Infection Prevention Recommendations for Individuals Confirmed to have, or Being Evaluated for, 2019 Novel Coronavirus (COVID-19) Infection Who Receive Care at Home  Individuals who are confirmed to have, or are being evaluated for, COVID-19 should follow the prevention steps below until a healthcare provider or local or state health department says they can return to normal activities.  Stay home except to get medical care You  should restrict activities outside your home, except for getting medical care. Do not go to work, school, or public areas, and do not use public transportation or taxis.  Call ahead before visiting your doctor Before your medical appointment, call the healthcare provider and tell them that you have, or are being evaluated for, COVID-19 infection. This will help the healthcare provider's office take steps to keep other people from getting infected. Ask your healthcare provider to call the local or state health department.  Monitor your symptoms Seek prompt medical attention if your illness is worsening (e.g., difficulty breathing). Before going to your medical appointment, call the healthcare provider and tell them that you have, or are being evaluated for, COVID-19 infection. Ask your healthcare provider to call the local or state health department.  Wear a facemask You should wear a facemask that covers your nose and mouth when you are in the same room with other people and when you visit a healthcare provider. People who live with or visit you should also wear a facemask while they are in the same room with you.  Separate yourself from other people in your home As much as possible, you should stay in a different room from other people in your home. Also, you should use a separate bathroom, if available.  Avoid sharing household items You should not share dishes, drinking glasses, cups, eating utensils, towels, bedding, or other items with other people in your home. After using these items, you should wash them thoroughly with soap and water.  Cover your coughs and sneezes Cover your mouth and nose with a tissue when  you cough or sneeze, or you can cough or sneeze into your sleeve. Throw used tissues in a lined trash can, and immediately wash your hands with soap and water for at least 20 seconds or use an alcohol-based hand rub.  Wash your [employer]hands Wash your hands often and thoroughly  with soap and water for at least 20 seconds. You can use an alcohol-based hand sanitizer if soap and water are not available and if your hands are not visibly dirty. Avoid touching your eyes, nose, and mouth with unwashed hands.   Prevention Steps for Caregivers and Household Members of Individuals Confirmed to have, or Being Evaluated for, COVID-19 Infection Being Cared for in the Home  If you live with, or provide care at home for, a person confirmed to have, or being evaluated for, COVID-19 infection please follow these guidelines to prevent infection:  Follow healthcare provider's instructions Make sure that you understand and can help the patient follow any healthcare provider instructions for all care.  Provide for the patient's basic needs You should help the patient with basic needs in the home and provide support for getting groceries, prescriptions, and other personal needs.  Monitor the patient's symptoms If they are getting sicker, call his or her medical provider and tell them that the patient has, or is being evaluated for, COVID-19 infection. This will help the healthcare provider's office take steps to keep other people from getting infected. Ask the healthcare provider to call the local or state health department.  Limit the number of people who have contact with the patient If possible, have only one caregiver for the patient. Other household members should stay in another home or place of residence. If this is not possible, they should stay in another room, or be separated from the patient as much as possible. Use a separate bathroom, if available. Restrict visitors who do not have an essential need to be in the home.  Keep older adults, very young children, and other sick people away from the patient Keep older adults, very young children, and those who have compromised immune systems or chronic health conditions away from the patient. This includes people with  chronic heart, lung, or kidney conditions, diabetes, and cancer.  Ensure good ventilation Make sure that shared spaces in the home have good air flow, such as from an air conditioner or an opened window, weather permitting.  Wash your hands often Wash your hands often and thoroughly with soap and water for at least 20 seconds. You can use an alcohol based hand sanitizer if soap and water are not available and if your hands are not visibly dirty. Avoid touching your eyes, nose, and mouth with unwashed hands. Use disposable paper towels to dry your hands. If not available, use dedicated cloth towels and replace them when they become wet.  Wear a facemask and gloves Wear a disposable facemask at all times in the room and gloves when you touch or have contact with the patient's blood, body fluids, and/or secretions or excretions, such as sweat, saliva, sputum, nasal mucus, vomit, urine, or feces.  Ensure the mask fits over your nose and mouth tightly, and do not touch it during use. Throw out disposable facemasks and gloves after using them. Do not reuse. Wash your hands immediately after removing your facemask and gloves. If your personal clothing becomes contaminated, carefully remove clothing and launder. Wash your hands after handling contaminated clothing. Place all used disposable facemasks, gloves, and other waste in a lined  container before disposing them with other household waste. Remove gloves and wash your hands immediately after handling these items.  Do not share dishes, glasses, or other household items with the patient Avoid sharing household items. You should not share dishes, drinking glasses, cups, eating utensils, towels, bedding, or other items with a patient who is confirmed to have, or being evaluated for, COVID-19 infection. After the person uses these items, you should wash them thoroughly with soap and water.  Wash laundry thoroughly Immediately remove and wash clothes  or bedding that have blood, body fluids, and/or secretions or excretions, such as sweat, saliva, sputum, nasal mucus, vomit, urine, or feces, on them. Wear gloves when handling laundry from the patient. Read and follow directions on labels of laundry or clothing items and detergent. In general, wash and dry with the warmest temperatures recommended on the label.  Clean all areas the individual has used often Clean all touchable surfaces, such as counters, tabletops, doorknobs, bathroom fixtures, toilets, phones, keyboards, tablets, and bedside tables, every day. Also, clean any surfaces that may have blood, body fluids, and/or secretions or excretions on them. Wear gloves when cleaning surfaces the patient has come in contact with. Use a diluted bleach solution (e.g., dilute bleach with 1 part bleach and 10 parts water) or a household disinfectant with a label that says EPA-registered for coronaviruses. To make a bleach solution at home, add 1 tablespoon of bleach to 1 quart (4 cups) of water. For a larger supply, add  cup of bleach to 1 gallon (16 cups) of water. Read labels of cleaning products and follow recommendations provided on product labels. Labels contain instructions for safe and effective use of the cleaning product including precautions you should take when applying the product, such as wearing gloves or eye protection and making sure you have good ventilation during use of the product. Remove gloves and wash hands immediately after cleaning.  Monitor yourself for signs and symptoms of illness Caregivers and household members are considered close contacts, should monitor their health, and will be asked to limit movement outside of the home to the extent possible. Follow the monitoring steps for close contacts listed on the symptom monitoring form.   ? If you have additional questions, contact your local health department or call the epidemiologist on call at [phone] (available  24/7). ? This guidance is subject to change. For the most up-to-date guidance from [facility], please refer to their website: [email or web address]

## 2019-02-08 NOTE — ED Triage Notes (Addendum)
C/o diff breathing worse today and chest tightness  Dx w covid 1 week ago  No distress noted

## 2019-02-08 NOTE — ED Provider Notes (Signed)
[facility] EMERGENCY DEPARTMENT Provider Note   CSN: [id number] Arrival date & time: 02/08/19  1427     History Chief Complaint  Patient presents with   Shortness of Breath    Evelyn Beck is a 38 y.o. female without significant past medical history who was diagnosed with COVID 19 on 02/01/19 with development of sxs 01/31/19 who presents to the ED with complaints of chest tightness & dyspnea worsening over the past few days. Patient states sxs began with fever, chills, nasal congestion & loose stools. Fever quickly resolved. Over the past few days she has developed constant chest tightness with shortness of breath. Sxs are without alleviating/aggravating factors. Tried taking tylenol/nsaids without much relief. States she primarily came to the ED due to concern about her breathing. Denies leg pain/swelling, hemoptysis, recent surgery/trauma, recent long travel, hormone use, personal hx of cancer, or hx of DVT/PE. Denies cough, nausea, vomiting, abdominal pain, lightheadedness, dizziness, or syncope.   HPI     History reviewed. No pertinent past medical history.  Patient Active Problem List   Diagnosis Date Noted   Dysplasia of cervix, low grade (CIN 1) 03/21/2013    Past Surgical History:  Procedure Laterality Date   AUGMENTATION MAMMAPLASTY  3382   silicone-    BREAST SURGERY     right lumpectomy     OB History    Gravida  2   Para  2   Term      Preterm      AB      Living  2     SAB      TAB      Ectopic      Multiple      Live Births              Family History  Problem Relation Age of Onset   Hypertension Father    Heart disease Father    Cancer Maternal Aunt        ?- cervical or uterine   Cancer Maternal Uncle        leukemia   Diabetes Paternal Grandmother    Stroke Sister     Social History   Tobacco Use   Smoking status: Never Smoker  Substance Use Topics   Alcohol use: Yes    Comment: socially    Drug use: Not on file    Home Medications Prior to Admission medications   Medication Sig Start Date End Date Taking? Authorizing Provider  aspirin-sod bicarb-citric acid (ALKA-SELTZER) 325 MG TBEF tablet Take 325 mg by mouth every 6 (six) hours as needed.    [provider]  doxycycline (VIBRAMYCIN) 100 MG capsule Take 1 capsule (100 mg total) by mouth 2 (two) times daily. 02/19/15   Mabe, Forbes Cellar, MD  ibuprofen (ADVIL,MOTRIN) 200 MG tablet Take 200 mg by mouth every 6 (six) hours as needed.    [provider]  ondansetron (ZOFRAN) 4 MG tablet Take 1 tablet (4 mg total) by mouth every 6 (six) hours. 02/19/15   Mabe, Forbes Cellar, MD  oxyCODONE-acetaminophen (PERCOCET/ROXICET) 5-325 MG tablet Take 1-2 tablets by mouth every 6 (six) hours as needed for severe pain. 02/19/15   Pixie Casino, MD    Allergies    Contrast media [iodinated diagnostic agents]  Review of Systems   Review of Systems  Constitutional: Positive for fever (resolved).  HENT: Positive for congestion. Negative for ear pain and sore throat.   Respiratory: Positive for chest tightness and shortness  of breath. Negative for cough.   Cardiovascular: Negative for leg swelling.  Gastrointestinal: Positive for diarrhea. Negative for abdominal pain, blood in stool, constipation, nausea and vomiting.  Genitourinary: Negative for dysuria.  Musculoskeletal: Negative for myalgias.  Neurological: Negative for syncope.  All other systems reviewed and are negative.   Physical Exam Updated Vital Signs BP (!) 138/102 (BP Location: Right Arm)    Pulse 95    Temp 98.6 F (37 C) (Oral)    Resp 18    Ht 5\' 6"  (1.676 m)    Wt 61.2 kg    LMP 02/03/2019 (Exact Date)    SpO2 100%    BMI 21.79 kg/m   Physical Exam Vitals and nursing note reviewed.  Constitutional:      General: She is not in acute distress.    Appearance: She is well-developed.  HENT:     Head: Normocephalic and atraumatic.     Right Ear: Ear canal  normal. Tympanic membrane is not perforated, erythematous, retracted or bulging.     Left Ear: Ear canal normal. Tympanic membrane is not perforated, erythematous, retracted or bulging.     Ears:     Comments: No mastoid erythema/swelling/tenderness.     Nose:     Right Sinus: No maxillary sinus tenderness or frontal sinus tenderness.     Left Sinus: No maxillary sinus tenderness or frontal sinus tenderness.     Mouth/Throat:     Pharynx: Uvula midline. No oropharyngeal exudate or posterior oropharyngeal erythema.     Comments: Posterior oropharynx is symmetric appearing. Patient tolerating own secretions without difficulty. No trismus. No drooling. No hot potato voice. No swelling beneath the tongue, submandibular compartment is soft.  Eyes:     General:        Right eye: No discharge.        Left eye: No discharge.     Conjunctiva/sclera: Conjunctivae normal.     Pupils: Pupils are equal, round, and reactive to light.  Cardiovascular:     Rate and Rhythm: Normal rate and regular rhythm.     Heart sounds: No murmur. No friction rub.     Comments: 2+ symmetric radial pulses. Pulmonary:     Effort: Pulmonary effort is normal. No respiratory distress.     Breath sounds: Normal breath sounds. No wheezing, rhonchi or rales.     Comments: SpO2 100% on RA @ rest and with ambulation.  Chest:     Chest wall: No tenderness or crepitus.  Abdominal:     General: There is no distension.     Palpations: Abdomen is soft.     Tenderness: There is no abdominal tenderness.  Musculoskeletal:     Cervical back: Normal range of motion and neck supple. No edema or rigidity.     Right lower leg: No tenderness. No edema.     Left lower leg: No tenderness. No edema.  Lymphadenopathy:     Cervical: No cervical adenopathy.  Skin:    General: Skin is warm and dry.     Findings: No rash.  Neurological:     Mental Status: She is alert.  Psychiatric:        Behavior: Behavior normal.     ED Results /  Procedures / Treatments   Labs (all labs ordered are listed, but only abnormal results are displayed) Labs Reviewed  CBC WITH DIFFERENTIAL/PLATELET - Abnormal; Notable for the following components:      Result Value   RBC 5.21 (*)  All other components within normal limits  COMPREHENSIVE METABOLIC PANEL  PREGNANCY, URINE  TROPONIN I (HIGH SENSITIVITY)    EKG EKG Interpretation  Date/Time:  Thursday February 08 2019 15:25:44 EST Ventricular Rate:  71 PR Interval:    QRS Duration: 72 QT Interval:  381 QTC Calculation: 414 R Axis:   86 Text Interpretation: Sinus rhythm Baseline wander in lead(s) V5 V6 No old tracing to compare Confirmed by Linwood Dibbles [phone]) on 02/08/2019 4:13:52 PM   Radiology DG Chest Port 1 View  Result Date: 02/08/2019 CLINICAL DATA:  Dyspnea. Additional history provided: Difficulty breathing, worse today, chest tightness, diagnosed with COCID 1 week ago. EXAM: PORTABLE CHEST 1 VIEW COMPARISON:  No pertinent prior studies available for comparison. FINDINGS: Heart size within normal limits. No airspace consolidation within the lungs. No evidence of pleural effusion or pneumothorax. No acute bony abnormality. IMPRESSION: No evidence of acute cardiopulmonary abnormality. Electronically Signed   By: Jackey Loge DO   On: 02/08/2019 15:13    Procedures Procedures (including critical care time)  Medications Ordered in ED Medications  albuterol (VENTOLIN HFA) 108 (90 Base) MCG/ACT inhaler 2 puff (has no administration in time range)  AeroChamber Plus Flo-Vu Medium MISC 1 each (has no administration in time range)    ED Course/MDM I have reviewed the triage vital signs and the nursing notes.  Pertinent labs & imaging results that were available during my care of the patient were reviewed by me and considered in my medical decision making (see chart for details).   Patient presents to the emergency department with complaints of dyspnea and chest tightness in  the setting of known positive COVID-19 testing 02/01/19.  Patient is nontoxic-appearing, resting comfortably, vitals WNL with the exception of elevated blood pressure, doubt HTN emergency.  She has an overall benign physical exam.  Lungs are clear to auscultation bilaterally.  Plan for labs, EKG, and chest x-ray.  Will trial albuterol with spacer.  CBC: No anemia or leukocytosis. CMP: No electrolyte derangement.  LFTs and renal function WNL. Pregnancy test: Negative High-sensitivity troponin: Less than 2 EKG: No significant ischemic changes or findings consistent with STEMI Chest x-ray: No evidence of acute cardiopulmonary abnormality.  Patient has a low risk heart pathway score, EKG without significant ischemic changes, high-sensitivity troponin less than 2 after greater than 6 hours of onset of constant chest tightness, doubt ACS.  She is low risk Wells, PERC negative, doubt PE.  No widened mediastinum on imaging, symmetric pulses, doubt dissection.  Chest x-ray is without infiltrate to suggest an acute bacterial pneumonia-she is also not having a productive cough or fevers anymore at this point.  Chest x-ray is also without findings of a pneumothorax.  Her EKG does not have ST changes consistent with pericarditis, additionally there is no friction rub on exam.  Patient does not appear in respiratory distress, she is able to maintain her SPO2 100% on room air with ambulation, appears appropriate for discharge home with continued use of inhaler, will also provide PRN atarax as patient states prior to arrival she did become quite anxious and thinks this may have worsened her breathing. She is feeling much better and states she is ready to go home. I discussed results, treatment plan, need for follow-up, and return precautions with the patient. Provided opportunity for questions, patient confirmed understanding and is in agreement with plan.    Evelyn Beck was evaluated in Emergency Department on  02/08/2019 for the symptoms described in the history of present illness.  He/she was evaluated in the context of the global COVID-19 pandemic, which necessitated consideration that the patient might be at risk for infection with the SARS-CoV-2 virus that causes COVID-19. Institutional protocols and algorithms that pertain to the evaluation of patients at risk for COVID-19 are in a state of rapid change based on information released by regulatory bodies including the CDC and federal and state organizations. These policies and algorithms were followed during the patient's care in the ED.   Final Clinical Impression(s) / ED Diagnoses Final diagnoses:  Shortness of breath    Rx / DC Orders ED Discharge Orders         Ordered    hydrOXYzine (ATARAX/VISTARIL) 10 MG tablet  3 times daily PRN     02/08/19 1629           Cherly Anderson, PA-C 02/08/19 1633    Sabas Sous, MD 02/12/19 2002

## 2019-02-08 NOTE — ED Notes (Signed)
Nurse first-pt seated in ED WR-NAD

## 2019-02-14 ENCOUNTER — Ambulatory Visit: Payer: Medicaid Other | Attending: Internal Medicine

## 2019-02-14 DIAGNOSIS — Z20822 Contact with and (suspected) exposure to covid-19: Secondary | ICD-10-CM

## 2019-02-15 LAB — NOVEL CORONAVIRUS, NAA: SARS-CoV-2, NAA: NOT DETECTED

## 2020-12-11 ENCOUNTER — Other Ambulatory Visit: Payer: Self-pay

## 2020-12-11 ENCOUNTER — Emergency Department (HOSPITAL_BASED_OUTPATIENT_CLINIC_OR_DEPARTMENT_OTHER): Payer: Self-pay

## 2020-12-11 ENCOUNTER — Encounter (HOSPITAL_BASED_OUTPATIENT_CLINIC_OR_DEPARTMENT_OTHER): Payer: Self-pay | Admitting: *Deleted

## 2020-12-11 ENCOUNTER — Observation Stay (HOSPITAL_BASED_OUTPATIENT_CLINIC_OR_DEPARTMENT_OTHER)
Admission: EM | Admit: 2020-12-11 | Discharge: 2020-12-12 | Disposition: A | Discharge status: Home / Self Care | Payer: Self-pay | Attending: General Surgery | Admitting: General Surgery

## 2020-12-11 DIAGNOSIS — K3580 Unspecified acute appendicitis: Secondary | ICD-10-CM

## 2020-12-11 DIAGNOSIS — Z7982 Long term (current) use of aspirin: Secondary | ICD-10-CM | POA: Insufficient documentation

## 2020-12-11 DIAGNOSIS — K37 Unspecified appendicitis: Secondary | ICD-10-CM | POA: Diagnosis present

## 2020-12-11 DIAGNOSIS — K353 Acute appendicitis with localized peritonitis, without perforation or gangrene: Principal | ICD-10-CM | POA: Insufficient documentation

## 2020-12-11 DIAGNOSIS — R109 Unspecified abdominal pain: Secondary | ICD-10-CM | POA: Diagnosis present

## 2020-12-11 DIAGNOSIS — Z20822 Contact with and (suspected) exposure to covid-19: Secondary | ICD-10-CM | POA: Insufficient documentation

## 2020-12-11 LAB — CBC
HCT: 42.8 % (ref 36.0–46.0)
Hemoglobin: 14.3 g/dL (ref 12.0–15.0)
MCH: 27.8 pg (ref 26.0–34.0)
MCHC: 33.4 g/dL (ref 30.0–36.0)
MCV: 83.1 fL (ref 80.0–100.0)
Platelets: 276 10*3/uL (ref 150–400)
RBC: 5.15 MIL/uL — ABNORMAL HIGH (ref 3.87–5.11)
RDW: 12.6 % (ref 11.5–15.5)
WBC: 16.5 10*3/uL — ABNORMAL HIGH (ref 4.0–10.5)
nRBC: 0 % (ref 0.0–0.2)

## 2020-12-11 LAB — URINALYSIS, ROUTINE W REFLEX MICROSCOPIC
Bilirubin Urine: NEGATIVE
Glucose, UA: NEGATIVE mg/dL
Ketones, ur: NEGATIVE mg/dL
Leukocytes,Ua: NEGATIVE
Nitrite: NEGATIVE
Protein, ur: NEGATIVE mg/dL
Specific Gravity, Urine: 1.025 (ref 1.005–1.030)
pH: 5.5 (ref 5.0–8.0)

## 2020-12-11 LAB — COMPREHENSIVE METABOLIC PANEL
ALT: 20 U/L (ref 0–44)
AST: 18 U/L (ref 15–41)
Albumin: 3.8 g/dL (ref 3.5–5.0)
Alkaline Phosphatase: 48 U/L (ref 38–126)
Anion gap: 9 (ref 5–15)
BUN: 15 mg/dL (ref 6–20)
CO2: 24 mmol/L (ref 22–32)
Calcium: 8.8 mg/dL — ABNORMAL LOW (ref 8.9–10.3)
Chloride: 102 mmol/L (ref 98–111)
Creatinine, Ser: 0.82 mg/dL (ref 0.44–1.00)
GFR, Estimated: >60 mL/min (ref >60)
Glucose, Bld: 94 mg/dL (ref 70–99)
Potassium: 4.1 mmol/L (ref 3.5–5.1)
Sodium: 135 mmol/L (ref 135–145)
Total Bilirubin: 0.4 mg/dL (ref 0.3–1.2)
Total Protein: 7.2 g/dL (ref 6.5–8.1)

## 2020-12-11 LAB — URINALYSIS, MICROSCOPIC (REFLEX)

## 2020-12-11 LAB — LIPASE, BLOOD: Lipase: 28 U/L (ref 11–51)

## 2020-12-11 LAB — RESP PANEL BY RT-PCR (FLU A&B, COVID) ARPGX2
Influenza A by PCR: NEGATIVE
Influenza B by PCR: NEGATIVE
SARS Coronavirus 2 by RT PCR: NEGATIVE

## 2020-12-11 LAB — PREGNANCY, URINE: Preg Test, Ur: NEGATIVE

## 2020-12-11 MED ORDER — MORPHINE SULFATE (PF) 4 MG/ML IV SOLN
4.0000 mg | Freq: Once | INTRAVENOUS | Status: AC
  Administered 2020-12-11: 4 mg via INTRAVENOUS
  Filled 2020-12-11: qty 1

## 2020-12-11 NOTE — ED Triage Notes (Signed)
C/o  diffuse abd pain x 3 hrs , denies n/v/d

## 2020-12-11 NOTE — ED Provider Notes (Signed)
[facility] EMERGENCY DEPARTMENT Provider Note   CSN: [id number] Arrival date & time: 12/11/20  2040     History Chief Complaint  Patient presents with   Abdominal Pain    Evelyn Beck is a 40 y.o. female.  HPI  Patient is a 40 year old female with a medical history as noted below.  She presents to the emergency department due to abdominal pain that started about 4 hours prior to arrival.  States her pain is diffuse.  Describes it as a bloating pain.  Pain worsens with palpation as well as any movement.  Denies any nausea, vomiting, diarrhea, fevers, chills, dysuria, vaginal discharge.  She states she finished her menstrual cycle this morning.     History reviewed. No pertinent past medical history.  Patient Active Problem List   Diagnosis Date Noted   Appendicitis 12/11/2020   Abdominal pain 12/11/2020   Dysplasia of cervix, low grade (CIN 1) 03/21/2013    Past Surgical History:  Procedure Laterality Date   AUGMENTATION MAMMAPLASTY  2014   silicone-    BREAST SURGERY     right lumpectomy     OB History     Gravida  2   Para  2   Term      Preterm      AB      Living  2      SAB      IAB      Ectopic      Multiple      Live Births              Family History  Problem Relation Age of Onset   Hypertension Father    Heart disease Father    Cancer Maternal Aunt        ?- cervical or uterine   Cancer Maternal Uncle        leukemia   Diabetes Paternal Grandmother    Stroke Sister     Social History   Tobacco Use   Smoking status: Never  Substance Use Topics   Alcohol use: Yes    Comment: socially    Home Medications Prior to Admission medications   Medication Sig Start Date End Date Taking? Authorizing Provider  aspirin-sod bicarb-citric acid (ALKA-SELTZER) 325 MG TBEF tablet Take 325 mg by mouth every 6 (six) hours as needed.    [provider]  doxycycline (VIBRAMYCIN) 100 MG capsule Take 1 capsule (100  mg total) by mouth 2 (two) times daily. 02/19/15   Phillis Haggis, MD  hydrOXYzine (ATARAX/VISTARIL) 10 MG tablet Take 1 tablet (10 mg total) by mouth 3 (three) times daily as needed for anxiety. 02/08/19   Petrucelli, Samantha R, PA-C  ibuprofen (ADVIL,MOTRIN) 200 MG tablet Take 200 mg by mouth every 6 (six) hours as needed.    [provider]  ondansetron (ZOFRAN) 4 MG tablet Take 1 tablet (4 mg total) by mouth every 6 (six) hours. 02/19/15   Mabe, Latanya Maudlin, MD  oxyCODONE-acetaminophen (PERCOCET/ROXICET) 5-325 MG tablet Take 1-2 tablets by mouth every 6 (six) hours as needed for severe pain. 02/19/15   Mabe, Latanya Maudlin, MD    Allergies    Contrast media [iodinated diagnostic agents]  Review of Systems   Review of Systems  All other systems reviewed and are negative. Ten systems reviewed and are negative for acute change, except as noted in the HPI.   Physical Exam Updated Vital Signs BP (!) 143/95 (BP Location: Left Arm)   Pulse  94   Temp 98.2 F (36.8 C) (Oral)   Resp 18   Ht 5\' 6"  (1.676 m)   Wt 62 kg   LMP 12/08/2020   SpO2 99%   BMI 22.06 kg/m   Physical Exam Vitals and nursing note reviewed.  Constitutional:      General: She is not in acute distress.    Appearance: Normal appearance. She is not ill-appearing, toxic-appearing or diaphoretic.  HENT:     Head: Normocephalic and atraumatic.     Right Ear: External ear normal.     Left Ear: External ear normal.     Nose: Nose normal.     Mouth/Throat:     Mouth: Mucous membranes are moist.     Pharynx: Oropharynx is clear. No oropharyngeal exudate or posterior oropharyngeal erythema.  Eyes:     Extraocular Movements: Extraocular movements intact.  Cardiovascular:     Rate and Rhythm: Normal rate and regular rhythm.     Pulses: Normal pulses.     Heart sounds: Normal heart sounds. No murmur heard.   No friction rub. No gallop.  Pulmonary:     Effort: Pulmonary effort is normal. No respiratory distress.      Breath sounds: Normal breath sounds. No stridor. No wheezing, rhonchi or rales.  Abdominal:     General: Abdomen is flat.     Palpations: Abdomen is soft.     Tenderness: There is generalized abdominal tenderness.     Comments: Abdomen is flat, soft.  Moderate tenderness noted diffusely across the abdomen that appears to be worst along the left upper quadrant and periumbilical regions.  Musculoskeletal:        General: Normal range of motion.     Cervical back: Normal range of motion and neck supple. No tenderness.  Skin:    General: Skin is warm and dry.  Neurological:     General: No focal deficit present.     Mental Status: She is alert and oriented to person, place, and time.  Psychiatric:        Mood and Affect: Mood normal.        Behavior: Behavior normal.   ED Results / Procedures / Treatments   Labs (all labs ordered are listed, but only abnormal results are displayed) Labs Reviewed  COMPREHENSIVE METABOLIC PANEL - Abnormal; Notable for the following components:      Result Value   Calcium 8.8 (*)    All other components within normal limits  CBC - Abnormal; Notable for the following components:   WBC 16.5 (*)    RBC 5.15 (*)    All other components within normal limits  URINALYSIS, ROUTINE W REFLEX MICROSCOPIC - Abnormal; Notable for the following components:   Hgb urine dipstick MODERATE (*)    All other components within normal limits  URINALYSIS, MICROSCOPIC (REFLEX) - Abnormal; Notable for the following components:   Bacteria, UA FEW (*)    All other components within normal limits  RESP PANEL BY RT-PCR (FLU A&B, COVID) ARPGX2  LIPASE, BLOOD  PREGNANCY, URINE   EKG None  Radiology CT ABDOMEN PELVIS WO CONTRAST  Result Date: 12/11/2020 CLINICAL DATA:  Abdomen pain EXAM: CT ABDOMEN AND PELVIS WITHOUT CONTRAST TECHNIQUE: Multidetector CT imaging of the abdomen and pelvis was performed following the standard protocol without IV contrast. COMPARISON:  CT  02/19/2015 FINDINGS: Lower chest: No acute abnormality. Hepatobiliary: No focal liver abnormality is seen. No gallstones, gallbladder wall thickening, or biliary dilatation. Pancreas: Unremarkable. No pancreatic ductal  dilatation or surrounding inflammatory changes. Spleen: Normal in size without focal abnormality. Adrenals/Urinary Tract: Adrenal glands are unremarkable. Kidneys are normal, without renal calculi, focal lesion, or hydronephrosis. Bladder is unremarkable. Stomach/Bowel: The stomach is nonenlarged. No dilated small bowel. No acute bowel wall thickening. Majority of the appendix appears nondilated and normal. There is possible mild enlargement of the tip of the appendix up to 8 mm, coronal series 5, image 51, and axial series 2 image 58. There may be subtle fat stranding around the tip of the appendix. Vascular/Lymphatic: No significant vascular findings are present. No enlarged abdominal or pelvic lymph nodes. Reproductive: Uterus and bilateral adnexa are unremarkable. Other: No abdominal wall hernia or abnormality. No abdominopelvic ascites. Musculoskeletal: No acute or significant osseous findings. IMPRESSION: 1. Most of the appendix appears within normal limits, however there may be slight enlargement of the tip of the appendix to 8 mm with possible subtle fat stranding. Consideration is given towards tip appendicitis. Negative for perforation or abscess. Negative for appendicoliths. 2. Otherwise no CT evidence for acute intra-abdominal or pelvic abnormality Electronically Signed   By: Jasmine Pang M.D.   On: 12/11/2020 22:00    Procedures Procedures   Medications Ordered in ED Medications  morphine 4 MG/ML injection 4 mg (4 mg Intravenous Given 12/11/20 2211)    ED Course  I have reviewed the triage vital signs and the nursing notes.  Pertinent labs & imaging results that were available during my care of the patient were reviewed by me and considered in my medical decision making (see  chart for details).    MDM Rules/Calculators/A&P                          Pt is a 40 y.o. female who presents to the emergency department due to abdominal pain.  Labs: CBC with a white count of 16.5 and RBCs of 5.15. CMP with a calcium of 8.8. Lipase of 28. UA with moderate hemoglobin and few bacteria. Pregnancy test is negative.  Imaging: CT scan of the abdomen/pelvis without contrast shows most of the appendix appearing normal, however there may be slight enlargement of the tip of the appendix to 8 mm with possible subtle fat stranding.  Consideration is given towards tip appendicitis.  Negative for perforation or abscess.  Negative for appendicoliths.  Otherwise no CT evidence for acute intra-abdominal or pelvic abnormality.  I, Placido Sou, PA-C, personally reviewed and evaluated these images and lab results as part of my medical decision-making.  CT scan concerning for possible appendicitis.  This was discussed with general surgery who agree with admission for further observation.  This was discussed with the patient and she is amenable.  Patient states that she had a small amount of soup around 8 PM tonight but otherwise has had no significant p.o. intake since 1:30 PM today.  Note: Portions of this report may have been transcribed using voice recognition software. Every effort was made to ensure accuracy; however, inadvertent computerized transcription errors may be present.   Final Clinical Impression(s) / ED Diagnoses Final diagnoses:  Acute appendicitis, unspecified acute appendicitis type   Rx / DC Orders ED Discharge Orders     None        Placido Sou, PA-C 12/11/20 2353    Benjiman Core, MD 12/12/20 [phone]

## 2020-12-12 ENCOUNTER — Observation Stay (HOSPITAL_COMMUNITY): Payer: Self-pay | Admitting: Anesthesiology

## 2020-12-12 ENCOUNTER — Encounter (HOSPITAL_COMMUNITY)
Admission: EM | Disposition: A | Discharge status: Home / Self Care | Payer: Self-pay | Source: Home / Self Care | Attending: Emergency Medicine

## 2020-12-12 ENCOUNTER — Encounter (HOSPITAL_COMMUNITY): Payer: Self-pay

## 2020-12-12 HISTORY — PX: LAPAROSCOPIC APPENDECTOMY: SHX408

## 2020-12-12 LAB — SURGICAL PCR SCREEN
MRSA, PCR: NEGATIVE
Staphylococcus aureus: NEGATIVE

## 2020-12-12 SURGERY — APPENDECTOMY, LAPAROSCOPIC
Anesthesia: General | Site: Abdomen

## 2020-12-12 MED ORDER — 0.9 % SODIUM CHLORIDE (POUR BTL) OPTIME
TOPICAL | Status: DC | PRN
  Administered 2020-12-12: 1000 mL

## 2020-12-12 MED ORDER — ACETAMINOPHEN 500 MG PO TABS
1000.0000 mg | ORAL_TABLET | Freq: Three times a day (TID) | ORAL | Status: DC
  Administered 2020-12-12: 1000 mg via ORAL
  Filled 2020-12-12: qty 2

## 2020-12-12 MED ORDER — ROCURONIUM BROMIDE 10 MG/ML (PF) SYRINGE
PREFILLED_SYRINGE | INTRAVENOUS | Status: DC | PRN
  Administered 2020-12-12: 50 mg via INTRAVENOUS

## 2020-12-12 MED ORDER — FENTANYL CITRATE (PF) 100 MCG/2ML IJ SOLN
INTRAMUSCULAR | Status: AC
  Filled 2020-12-12: qty 2

## 2020-12-12 MED ORDER — ONDANSETRON HCL 4 MG/2ML IJ SOLN
INTRAMUSCULAR | Status: DC | PRN
  Administered 2020-12-12: 4 mg via INTRAVENOUS

## 2020-12-12 MED ORDER — SODIUM CHLORIDE 0.9 % IV SOLN
2.0000 g | Freq: Once | INTRAVENOUS | Status: AC
  Administered 2020-12-12: 2 g via INTRAVENOUS
  Filled 2020-12-12: qty 20

## 2020-12-12 MED ORDER — ACETAMINOPHEN 500 MG PO TABS: 1000.0000 mg | ORAL_TABLET | Freq: Once | ORAL | Status: DC

## 2020-12-12 MED ORDER — LIDOCAINE HCL (PF) 2 % IJ SOLN
INTRAMUSCULAR | Status: AC
  Filled 2020-12-12: qty 5

## 2020-12-12 MED ORDER — OXYCODONE HCL 5 MG PO TABS: 5.0000 mg | ORAL_TABLET | Freq: Once | ORAL | Status: DC | PRN

## 2020-12-12 MED ORDER — BUPIVACAINE HCL 0.25 % IJ SOLN
INTRAMUSCULAR | Status: AC
  Filled 2020-12-12: qty 1

## 2020-12-12 MED ORDER — SODIUM CHLORIDE 0.9 % IV SOLN
2.0000 g | INTRAVENOUS | Status: DC
  Filled 2020-12-12: qty 20

## 2020-12-12 MED ORDER — ONDANSETRON HCL 4 MG/2ML IJ SOLN: 4.0000 mg | Freq: Four times a day (QID) | INTRAMUSCULAR | Status: DC | PRN

## 2020-12-12 MED ORDER — LACTATED RINGERS IV SOLN
INTRAVENOUS | Status: AC | PRN
  Administered 2020-12-12: 1000 mL

## 2020-12-12 MED ORDER — FENTANYL CITRATE (PF) 250 MCG/5ML IJ SOLN
INTRAMUSCULAR | Status: DC | PRN
  Administered 2020-12-12 (×2): 100 ug via INTRAVENOUS

## 2020-12-12 MED ORDER — FENTANYL CITRATE PF 50 MCG/ML IJ SOSY
PREFILLED_SYRINGE | INTRAMUSCULAR | Status: AC
  Filled 2020-12-12: qty 3

## 2020-12-12 MED ORDER — LACTATED RINGERS IV SOLN: INTRAVENOUS | Status: DC

## 2020-12-12 MED ORDER — FENTANYL CITRATE PF 50 MCG/ML IJ SOSY
25.0000 ug | PREFILLED_SYRINGE | INTRAMUSCULAR | Status: DC | PRN
  Administered 2020-12-12 (×2): 50 ug via INTRAVENOUS

## 2020-12-12 MED ORDER — OXYCODONE HCL 5 MG PO TABS
5.0000 mg | ORAL_TABLET | ORAL | Status: DC | PRN
  Filled 2020-12-12: qty 2

## 2020-12-12 MED ORDER — MUPIROCIN 2 % EX OINT: 1.0000 "application " | TOPICAL_OINTMENT | Freq: Two times a day (BID) | CUTANEOUS | Status: DC

## 2020-12-12 MED ORDER — OXYCODONE HCL 5 MG PO TABS: 5.0000 mg | ORAL_TABLET | Freq: Four times a day (QID) | ORAL | 0 refills | Status: AC | PRN

## 2020-12-12 MED ORDER — PROMETHAZINE HCL 25 MG/ML IJ SOLN: 6.2500 mg | INTRAMUSCULAR | Status: DC | PRN

## 2020-12-12 MED ORDER — SIMETHICONE 80 MG PO CHEW: 40.0000 mg | CHEWABLE_TABLET | Freq: Four times a day (QID) | ORAL | Status: DC | PRN

## 2020-12-12 MED ORDER — OXYCODONE HCL 5 MG/5ML PO SOLN: 5.0000 mg | Freq: Once | ORAL | Status: DC | PRN

## 2020-12-12 MED ORDER — KETAMINE HCL 10 MG/ML IJ SOLN
INTRAMUSCULAR | Status: DC | PRN
  Administered 2020-12-12: 30 mg via INTRAVENOUS

## 2020-12-12 MED ORDER — DEXAMETHASONE SODIUM PHOSPHATE 10 MG/ML IJ SOLN
INTRAMUSCULAR | Status: AC
  Filled 2020-12-12: qty 1

## 2020-12-12 MED ORDER — CELECOXIB 200 MG PO CAPS
400.0000 mg | ORAL_CAPSULE | ORAL | Status: AC
  Administered 2020-12-12: 400 mg via ORAL
  Filled 2020-12-12: qty 2

## 2020-12-12 MED ORDER — LACTATED RINGERS IV SOLN: INTRAVENOUS | Status: DC | PRN

## 2020-12-12 MED ORDER — DEXAMETHASONE SODIUM PHOSPHATE 10 MG/ML IJ SOLN
INTRAMUSCULAR | Status: DC | PRN
  Administered 2020-12-12: 10 mg via INTRAVENOUS

## 2020-12-12 MED ORDER — MORPHINE SULFATE (PF) 4 MG/ML IV SOLN
4.0000 mg | Freq: Once | INTRAVENOUS | Status: AC
Start: 2020-12-12 — End: 2020-12-12
  Administered 2020-12-12: 4 mg via INTRAVENOUS
  Filled 2020-12-12: qty 1

## 2020-12-12 MED ORDER — ROCURONIUM BROMIDE 10 MG/ML (PF) SYRINGE
PREFILLED_SYRINGE | INTRAVENOUS | Status: AC
  Filled 2020-12-12: qty 10

## 2020-12-12 MED ORDER — SUGAMMADEX SODIUM 200 MG/2ML IV SOLN
INTRAVENOUS | Status: DC | PRN
  Administered 2020-12-12: 150 mg via INTRAVENOUS

## 2020-12-12 MED ORDER — SODIUM CHLORIDE 0.9 % IV SOLN: INTRAVENOUS | Status: DC

## 2020-12-12 MED ORDER — ONDANSETRON 4 MG PO TBDP: 4.0000 mg | ORAL_TABLET | Freq: Three times a day (TID) | ORAL | 0 refills | Status: AC | PRN

## 2020-12-12 MED ORDER — ACETAMINOPHEN 500 MG PO TABS
1000.0000 mg | ORAL_TABLET | ORAL | Status: AC
  Administered 2020-12-12: 1000 mg via ORAL
  Filled 2020-12-12: qty 2

## 2020-12-12 MED ORDER — SCOPOLAMINE 1 MG/3DAYS TD PT72
1.0000 | MEDICATED_PATCH | Freq: Once | TRANSDERMAL | Status: DC
  Administered 2020-12-12: 1.5 mg via TRANSDERMAL
  Filled 2020-12-12: qty 1

## 2020-12-12 MED ORDER — PROPOFOL 10 MG/ML IV BOLUS
INTRAVENOUS | Status: DC | PRN
  Administered 2020-12-12: 120 mg via INTRAVENOUS

## 2020-12-12 MED ORDER — LIDOCAINE 2% (20 MG/ML) 5 ML SYRINGE
INTRAMUSCULAR | Status: DC | PRN
  Administered 2020-12-12: 60 mg via INTRAVENOUS

## 2020-12-12 MED ORDER — MIDAZOLAM HCL 2 MG/2ML IJ SOLN
INTRAMUSCULAR | Status: AC
  Filled 2020-12-12: qty 2

## 2020-12-12 MED ORDER — PROPOFOL 10 MG/ML IV BOLUS
INTRAVENOUS | Status: AC
  Filled 2020-12-12: qty 20

## 2020-12-12 MED ORDER — METRONIDAZOLE 500 MG/100ML IV SOLN
500.0000 mg | Freq: Once | INTRAVENOUS | Status: AC
  Administered 2020-12-12: 500 mg via INTRAVENOUS
  Filled 2020-12-12: qty 100

## 2020-12-12 MED ORDER — ONDANSETRON HCL 4 MG/2ML IJ SOLN
INTRAMUSCULAR | Status: AC
  Filled 2020-12-12: qty 2

## 2020-12-12 MED ORDER — KCL IN DEXTROSE-NACL 20-5-0.45 MEQ/L-%-% IV SOLN
INTRAVENOUS | Status: DC
  Filled 2020-12-12: qty 1000

## 2020-12-12 MED ORDER — BUPIVACAINE HCL 0.25 % IJ SOLN
INTRAMUSCULAR | Status: DC | PRN
  Administered 2020-12-12: 10 mL

## 2020-12-12 MED ORDER — MORPHINE SULFATE (PF) 2 MG/ML IV SOLN
2.0000 mg | INTRAVENOUS | Status: DC | PRN
  Administered 2020-12-12: 2 mg via INTRAVENOUS
  Filled 2020-12-12: qty 1

## 2020-12-12 MED ORDER — MIDAZOLAM HCL 5 MG/5ML IJ SOLN
INTRAMUSCULAR | Status: DC | PRN
  Administered 2020-12-12: 2 mg via INTRAVENOUS

## 2020-12-12 MED ORDER — ONDANSETRON 4 MG PO TBDP
4.0000 mg | ORAL_TABLET | Freq: Four times a day (QID) | ORAL | Status: DC | PRN
  Administered 2020-12-12: 4 mg via ORAL
  Filled 2020-12-12: qty 1

## 2020-12-12 MED ORDER — METRONIDAZOLE 500 MG/100ML IV SOLN
500.0000 mg | Freq: Two times a day (BID) | INTRAVENOUS | Status: DC
  Filled 2020-12-12: qty 100

## 2020-12-12 MED ORDER — CHLORHEXIDINE GLUCONATE CLOTH 2 % EX PADS
6.0000 | MEDICATED_PAD | Freq: Once | CUTANEOUS | Status: AC
  Administered 2020-12-12: 6 via TOPICAL

## 2020-12-12 MED ORDER — ENOXAPARIN SODIUM 40 MG/0.4ML IJ SOSY
40.0000 mg | PREFILLED_SYRINGE | INTRAMUSCULAR | Status: DC
  Administered 2020-12-12: 40 mg via SUBCUTANEOUS
  Filled 2020-12-12: qty 0.4

## 2020-12-12 SURGICAL SUPPLY — 42 items
BAG COUNTER SPONGE SURGICOUNT (BAG) IMPLANT
CABLE HIGH FREQUENCY MONO STRZ (ELECTRODE) ×2 IMPLANT
CHLORAPREP W/TINT 26 (MISCELLANEOUS) ×2 IMPLANT
COVER SURGICAL LIGHT HANDLE (MISCELLANEOUS) ×2 IMPLANT
CUTTER FLEX LINEAR 45M (STAPLE) ×2 IMPLANT
DECANTER SPIKE VIAL GLASS SM (MISCELLANEOUS) ×2 IMPLANT
DERMABOND ADVANCED (GAUZE/BANDAGES/DRESSINGS) ×1
DERMABOND ADVANCED .7 DNX12 (GAUZE/BANDAGES/DRESSINGS) ×1 IMPLANT
DEVICE TROCAR PUNCTURE CLOSURE (ENDOMECHANICALS) ×2 IMPLANT
DRAPE LAPAROSCOPIC ABDOMINAL (DRAPES) ×2 IMPLANT
ELECT REM PT RETURN 15FT ADLT (MISCELLANEOUS) ×2 IMPLANT
GLOVE SURG ENC MOIS LTX SZ7 (GLOVE) ×4 IMPLANT
GLOVE SURG UNDER POLY LF SZ7 (GLOVE) ×2 IMPLANT
GLOVE SURG UNDER POLY LF SZ7.5 (GLOVE) ×4 IMPLANT
GOWN STRL REUS W/ TWL XL LVL3 (GOWN DISPOSABLE) ×1 IMPLANT
GOWN STRL REUS W/TWL LRG LVL3 (GOWN DISPOSABLE) ×4 IMPLANT
GOWN STRL REUS W/TWL XL LVL3 (GOWN DISPOSABLE) ×3 IMPLANT
IRRIG SUCT STRYKERFLOW 2 WTIP (MISCELLANEOUS) ×2
IRRIGATION SUCT STRKRFLW 2 WTP (MISCELLANEOUS) ×1 IMPLANT
KIT BASIN OR (CUSTOM PROCEDURE TRAY) ×2 IMPLANT
NS IRRIG 1000ML POUR BTL (IV SOLUTION) ×2 IMPLANT
PENCIL SMOKE EVACUATOR (MISCELLANEOUS) IMPLANT
POUCH RETRIEVAL ECOSAC 10 (ENDOMECHANICALS) ×1 IMPLANT
POUCH RETRIEVAL ECOSAC 10MM (ENDOMECHANICALS) ×1
RELOAD 45 VASCULAR/THIN (ENDOMECHANICALS) IMPLANT
RELOAD STAPLE TA45 3.5 REG BLU (ENDOMECHANICALS) ×2 IMPLANT
SET TUBE SMOKE EVAC HIGH FLOW (TUBING) ×2 IMPLANT
SHEARS HARMONIC ACE PLUS 36CM (ENDOMECHANICALS) ×2 IMPLANT
SLEEVE XCEL OPT CAN 5 100 (ENDOMECHANICALS) ×2 IMPLANT
SOL ANTI FOG 6CC (MISCELLANEOUS) ×1 IMPLANT
SOLUTION ANTI FOG 6CC (MISCELLANEOUS) ×1
STRIP CLOSURE SKIN 1/2X4 (GAUZE/BANDAGES/DRESSINGS) ×2 IMPLANT
SUT MNCRL AB 4-0 PS2 18 (SUTURE) ×2 IMPLANT
SUT VICRYL 0 ENDOLOOP (SUTURE) IMPLANT
SUT VICRYL 0 UR6 27IN ABS (SUTURE) ×2 IMPLANT
TOWEL OR 17X26 10 PK STRL BLUE (TOWEL DISPOSABLE) ×2 IMPLANT
TOWEL OR NON WOVEN STRL DISP B (DISPOSABLE) ×2 IMPLANT
TRAY FOLEY MTR SLVR 14FR STAT (SET/KITS/TRAYS/PACK) ×2 IMPLANT
TRAY FOLEY MTR SLVR 16FR STAT (SET/KITS/TRAYS/PACK) IMPLANT
TRAY LAPAROSCOPIC (CUSTOM PROCEDURE TRAY) ×2 IMPLANT
TROCAR XCEL BLUNT TIP 100MML (ENDOMECHANICALS) ×2 IMPLANT
TROCAR XCEL NON-BLD 5MMX100MML (ENDOMECHANICALS) ×2 IMPLANT

## 2020-12-12 NOTE — Op Note (Signed)
Preoperative diagnosis: acute appendicitis Postoperative diagnosis: same as above Procedure: laparoscopic appendectomy Surgeon: Dr Harden Mo Anesthesia: general EBL: minimal Specimens appendix to pathology Complications none Drains none Sponge and needle count correct Dispo recovery stable.   Indications: This is a 58 yof with rlq pain and tenderness with ct showing acute appendicitis. We discussed proceeding to the OR for appendectomy.   Procedure: After informed consent was obtained the patient was taken the operating.  She was already given antibiotics.  SCDs were in place.  She was placed under general anesthesia without complication.  She was prepped and draped in the standard sterile surgical fashion.  A surgical timeout was then performed.  I filtrated Marcaine below the umbilicus.  I made a vertical incision.  I grasped the fascia.  I incised this sharply and entered the peritoneum bluntly.  I placed a 0 Vicryl pursestring suture through the fascia and inserted a Hassan trocar.  The abdomen was insufflated 15 mmHg pressure.  I then inserted 2 additional 5 mm trocars in the suprapubic and left lower quadrant.  The appendix was then visualized she had acute appendicitis.  This was not perforated.   The appendiceal mesentery was then divided with the harmonic scalpel taking care to identify both the terminal ileum and the cecum.  We then encircled the base.  We divide this with a GIA stapler.  The remainder of the appendix was removed from the mesentery and placed in a retrieval bag.  This was removed from the abdomen.  This was hemostatic.  The staple line was also clean and hemostatic after cauterizing a bleeding spot.  I then removed the [facility] trocar and tied the pursestring down.  I placed an additional 0 Vicryl suture using the Endo Close device.  The abdomen was then desufflated and the trocars removed.  These were closed with 4-0 Monocryl and glue.  She tolerated this well was  extubated transferred to recovery stable.

## 2020-12-12 NOTE — Anesthesia Preprocedure Evaluation (Addendum)
Anesthesia Evaluation  Patient identified by MRN, date of birth, ID band Patient awake    Reviewed: Allergy & Precautions, NPO status , Patient's Chart, lab work & pertinent test results  History of Anesthesia Complications Negative for: history of anesthetic complications  Airway Mallampati: II  TM Distance: >3 FB Neck ROM: Full    Dental no notable dental hx.    Pulmonary neg pulmonary ROS,    Pulmonary exam normal        Cardiovascular negative cardio ROS Normal cardiovascular exam     Neuro/Psych negative neurological ROS  negative psych ROS   GI/Hepatic Neg liver ROS, Acute appendicitis   Endo/Other  negative endocrine ROS  Renal/GU negative Renal ROS  negative genitourinary   Musculoskeletal negative musculoskeletal ROS (+)   Abdominal   Peds  Hematology negative hematology ROS (+)   Anesthesia Other Findings Day of surgery medications reviewed with patient.  Reproductive/Obstetrics negative OB ROS                            Anesthesia Physical Anesthesia Plan  ASA: 1 and emergent  Anesthesia Plan: General   Post-op Pain Management:    Induction: Intravenous  PONV Risk Score and Plan: 3 and Treatment may vary due to age or medical condition, Ondansetron, Dexamethasone, Midazolam and Scopolamine patch - Pre-op  Airway Management Planned: Oral ETT  Additional Equipment: None  Intra-op Plan:   Post-operative Plan: Extubation in OR  Informed Consent: I have reviewed the patients History and Physical, chart, labs and discussed the procedure including the risks, benefits and alternatives for the proposed anesthesia with the patient or authorized representative who has indicated his/her understanding and acceptance.     Dental advisory given  Plan Discussed with: CRNA  Anesthesia Plan Comments:        Anesthesia Quick Evaluation

## 2020-12-12 NOTE — H&P (Signed)
Evelyn Beck is an 40 y.o. female.   Chief Complaint: ab pain HPI: 45 yof with less than 24 hours of abdominal pain that came on abruptly. Mostly lower abdomen primarily right side.  Some nausea on way over in ambulance. No emesis, had bm in last 24 hours.  No fevers.  Nothing she was doing at home was helping so presented to outside er.  She underwent evaluation and was found to have elevated wbc.   Her ct scan shows some enlargement of tip of appendix (64mm) and some fat stranding.  She was transferred here for evaluation.  History reviewed. No pertinent past medical history.  Past Surgical History:  Procedure Laterality Date   AUGMENTATION MAMMAPLASTY  2014   silicone-    BREAST SURGERY     right lumpectomy    Family History  Problem Relation Age of Onset   Hypertension Father    Heart disease Father    Cancer Maternal Aunt        ?- cervical or uterine   Cancer Maternal Uncle        leukemia   Diabetes Paternal Grandmother    Stroke Sister    Social History:  reports that she has never smoked. She does not have any smokeless tobacco history on file. She reports current alcohol use. No history on file for drug use.  Allergies:  Allergies  Allergen Reactions   Contrast Media [Iodinated Diagnostic Agents] Swelling    Swelling under eyes, redness to sclera, reported nasal congestion onset after admin of contrast media    Medications Prior to Admission  Medication Sig Dispense Refill   aspirin-sod bicarb-citric acid (ALKA-SELTZER) 325 MG TBEF tablet Take 325 mg by mouth every 6 (six) hours as needed.     doxycycline (VIBRAMYCIN) 100 MG capsule Take 1 capsule (100 mg total) by mouth 2 (two) times daily. 28 capsule 0   hydrOXYzine (ATARAX/VISTARIL) 10 MG tablet Take 1 tablet (10 mg total) by mouth 3 (three) times daily as needed for anxiety. 12 tablet 0   ibuprofen (ADVIL,MOTRIN) 200 MG tablet Take 200 mg by mouth every 6 (six) hours as needed.     ondansetron (ZOFRAN) 4 MG  tablet Take 1 tablet (4 mg total) by mouth every 6 (six) hours. 12 tablet 0   oxyCODONE-acetaminophen (PERCOCET/ROXICET) 5-325 MG tablet Take 1-2 tablets by mouth every 6 (six) hours as needed for severe pain. 15 tablet 0    Results for orders placed or performed during the hospital encounter of 12/11/20 (from the past 48 hour(s))  Urinalysis, Routine w reflex microscopic Urine, Clean Catch     Status: Abnormal   Collection Time: 12/11/20  9:14 PM  Result Value Ref Range   Color, Urine YELLOW YELLOW   APPearance CLEAR CLEAR   Specific Gravity, Urine 1.025 1.005 - 1.030   pH 5.5 5.0 - 8.0   Glucose, UA NEGATIVE NEGATIVE mg/dL   Hgb urine dipstick MODERATE (A) NEGATIVE   Bilirubin Urine NEGATIVE NEGATIVE   Ketones, ur NEGATIVE NEGATIVE mg/dL   Protein, ur NEGATIVE NEGATIVE mg/dL   Nitrite NEGATIVE NEGATIVE   Leukocytes,Ua NEGATIVE NEGATIVE    Comment: Performed at [facility], [address] [place], [place] [address]  Pregnancy, urine     Status: None   Collection Time: 12/11/20  9:14 PM  Result Value Ref Range   Preg Test, Ur NEGATIVE NEGATIVE    Comment:        THE SENSITIVITY OF THIS METHODOLOGY IS >  20 mIU/mL. Performed at [facility], [address] [place], [place] [address]   Urinalysis, Microscopic (reflex)     Status: Abnormal   Collection Time: 12/11/20  9:14 PM  Result Value Ref Range   RBC / HPF 0-5 0 - 5 RBC/hpf   WBC, UA 0-5 0 - 5 WBC/hpf   Bacteria, UA FEW (A) NONE SEEN   Squamous Epithelial / LPF 0-5 0 - 5   Mucus PRESENT     Comment: Performed at [facility], [address] [place], [place] [address]  Lipase, blood     Status: None   Collection Time: 12/11/20  9:40 PM  Result Value Ref Range   Lipase 28 11 - 51 U/L    Comment: Performed at [facility], [address] [place], [place] [address]  Comprehensive metabolic panel     Status: Abnormal   Collection Time: 12/11/20  9:40 PM  Result Value Ref Range    Sodium 135 135 - 145 mmol/L   Potassium 4.1 3.5 - 5.1 mmol/L   Chloride 102 98 - 111 mmol/L   CO2 24 22 - 32 mmol/L   Glucose, Bld 94 70 - 99 mg/dL    Comment: Glucose reference range applies only to samples taken after fasting for at least 8 hours.   BUN 15 6 - 20 mg/dL   Creatinine, Ser 6.44 0.44 - 1.00 mg/dL   Calcium 8.8 (L) 8.9 - 10.3 mg/dL   Total Protein 7.2 6.5 - 8.1 g/dL   Albumin 3.8 3.5 - 5.0 g/dL   AST 18 15 - 41 U/L   ALT 20 0 - 44 U/L   Alkaline Phosphatase 48 38 - 126 U/L   Total Bilirubin 0.4 0.3 - 1.2 mg/dL   GFR, Estimated >03 >47 mL/min    Comment: (NOTE) Calculated using the CKD-EPI Creatinine Equation (2021)    Anion gap 9 5 - 15    Comment: Performed at [facility], [address] [place], [place] [address]  CBC     Status: Abnormal   Collection Time: 12/11/20  9:40 PM  Result Value Ref Range   WBC 16.5 (H) 4.0 - 10.5 K/uL   RBC 5.15 (H) 3.87 - 5.11 MIL/uL   Hemoglobin 14.3 12.0 - 15.0 g/dL   HCT 63.8 75.6 - 43.3 %   MCV 83.1 80.0 - 100.0 fL   MCH 27.8 26.0 - 34.0 pg   MCHC 33.4 30.0 - 36.0 g/dL   RDW 29.5 18.8 - 41.6 %   Platelets 276 150 - 400 K/uL   nRBC 0.0 0.0 - 0.2 %    Comment: Performed at [facility], [address] [place], [place] [address]  Resp Panel by RT-PCR (Flu A&B, Covid) Nasopharyngeal Swab     Status: None   Collection Time: 12/11/20 11:14 PM   Specimen: Nasopharyngeal Swab; Nasopharyngeal(NP) swabs in vial transport medium  Result Value Ref Range   SARS Coronavirus 2 by RT PCR NEGATIVE NEGATIVE    Comment: (NOTE) SARS-CoV-2 target nucleic acids are NOT DETECTED.  The SARS-CoV-2 RNA is generally detectable in upper respiratory specimens during the acute phase of infection. The lowest concentration of SARS-CoV-2 viral copies this assay can detect is 138 copies/mL. A negative result does not preclude SARS-Cov-2 infection and should not be used as the sole basis for treatment or other patient  management decisions. A negative result may occur with  improper specimen collection/handling, submission of specimen other than nasopharyngeal swab, presence of viral mutation(s) within the areas targeted by this assay, and inadequate number of viral copies(<138 copies/mL). A negative result must be combined with clinical observations, patient history, and epidemiological information. The expected result is Negative.  Fact Sheet for Patients:  BloggerCourse.com  Fact Sheet for Healthcare Providers:  SeriousBroker.it  This test is no t yet approved or cleared by the [place] FDA and  has been authorized for detection and/or diagnosis of SARS-CoV-2 by FDA under an Emergency Use Authorization (EUA). This EUA will remain  in effect (meaning this test can be used) for the duration of the COVID-19 declaration under Section 564(b)(1) of the Act, 21 U.S.C.section 360bbb-3(b)(1), unless the authorization is terminated  or revoked sooner.       Influenza A by PCR NEGATIVE NEGATIVE   Influenza B by PCR NEGATIVE NEGATIVE    Comment: (NOTE) The Xpert Xpress SARS-CoV-2/FLU/RSV plus assay is intended as an aid in the diagnosis of influenza from Nasopharyngeal swab specimens and should not be used as a sole basis for treatment. Nasal washings and aspirates are unacceptable for Xpert Xpress SARS-CoV-2/FLU/RSV testing.  Fact Sheet for Patients: BloggerCourse.com  Fact Sheet for Healthcare Providers: SeriousBroker.it  This test is not yet approved or cleared by the [place] FDA and has been authorized for detection and/or diagnosis of SARS-CoV-2 by FDA under an Emergency Use Authorization (EUA). This EUA will remain in effect (meaning this test can be used) for the duration of the COVID-19 declaration under Section 564(b)(1) of the Act, 21 U.S.C. section 360bbb-3(b)(1), unless the  authorization is terminated or revoked.  Performed at [facility], [address] [place], [place] [address]    CT ABDOMEN PELVIS WO CONTRAST  Result Date: 12/11/2020 CLINICAL DATA:  Abdomen pain EXAM: CT ABDOMEN AND PELVIS WITHOUT CONTRAST TECHNIQUE: Multidetector CT imaging of the abdomen and pelvis was performed following the standard protocol without IV contrast. COMPARISON:  CT 02/19/2015 FINDINGS: Lower chest: No acute abnormality. Hepatobiliary: No focal liver abnormality is seen. No gallstones, gallbladder wall thickening, or biliary dilatation. Pancreas: Unremarkable. No pancreatic ductal dilatation or surrounding inflammatory changes. Spleen: Normal in size without focal abnormality. Adrenals/Urinary Tract: Adrenal glands are unremarkable. Kidneys are normal, without renal calculi, focal lesion, or hydronephrosis. Bladder is unremarkable. Stomach/Bowel: The stomach is nonenlarged. No dilated small bowel. No acute bowel wall thickening. Majority of the appendix appears nondilated and normal. There is possible mild enlargement of the tip of the appendix up to 8 mm, coronal series 5, image 51, and axial series 2 image 58. There may be subtle fat stranding around the tip of the appendix. Vascular/Lymphatic: No significant vascular findings are present. No enlarged abdominal or pelvic lymph nodes. Reproductive: Uterus and bilateral adnexa are unremarkable. Other: No abdominal wall hernia or abnormality. No abdominopelvic ascites. Musculoskeletal: No acute or significant osseous findings. IMPRESSION: 1. Most of the appendix appears within normal limits, however there may be slight enlargement of the tip of the appendix to 8 mm with possible subtle fat stranding. Consideration is given towards tip appendicitis. Negative for perforation or abscess. Negative for appendicoliths. 2. Otherwise no CT evidence for acute intra-abdominal or pelvic abnormality Electronically Signed   By: Jasmine Pang M.D.   On: 12/11/2020 22:00    Review of Systems  Constitutional:  Negative for fever.  Gastrointestinal:  Positive for abdominal pain and nausea. Negative for constipation and vomiting.  All other systems reviewed  and are negative.  Blood pressure (!) 154/98, pulse 85, temperature 97.7 F (36.5 C), resp. rate 18, height 5\' 6"  (1.676 m), weight 62 kg, last menstrual period 12/08/2020, SpO2 100 %. Physical Exam Constitutional:      Appearance: She is well-developed.  Eyes:     General: No scleral icterus. Cardiovascular:     Rate and Rhythm: Normal rate.  Pulmonary:     Effort: Pulmonary effort is normal.  Abdominal:     General: Bowel sounds are normal.     Palpations: Abdomen is soft.     Tenderness: There is abdominal tenderness in the right lower quadrant and suprapubic area. There is guarding.     Hernia: No hernia is present.  Skin:    General: Skin is warm and dry.  Neurological:     General: No focal deficit present.     Mental Status: She is alert.  Psychiatric:        Mood and Affect: Mood normal.        Behavior: Behavior normal.     Assessment/Plan Likely appendicitis -ct a little underwhelming but has good story, tender rlq on exam, wbc up so I do think she has appendicitis clinically. I discussed proceeding with laparoscopic appendectomy today. Risks, recovery both discussed -will proceed with surgery when OR available  12/10/2020, MD 12/12/2020, 9:08 AM

## 2020-12-12 NOTE — Discharge Instructions (Signed)

## 2020-12-12 NOTE — Anesthesia Postprocedure Evaluation (Signed)
Anesthesia Post Note  Patient: Evelyn Beck  Procedure(s) Performed: APPENDECTOMY LAPAROSCOPIC (Abdomen)     Patient location during evaluation: PACU Anesthesia Type: General Level of consciousness: awake and alert and oriented Pain management: pain level controlled Vital Signs Assessment: post-procedure vital signs reviewed and stable Respiratory status: spontaneous breathing, nonlabored ventilation and respiratory function stable Cardiovascular status: blood pressure returned to baseline Postop Assessment: no apparent nausea or vomiting Anesthetic complications: no   No notable events documented.  Last Vitals:  Vitals:   12/12/20 1200 12/12/20 1215  BP: 128/82 121/84  Pulse: 68 78  Resp: 18 13  Temp:    SpO2: 100% 97%    Last Pain:  Vitals:   12/12/20 1215  TempSrc:   PainSc: Asleep                 Shanda Howells

## 2020-12-12 NOTE — Transfer of Care (Signed)
Immediate Anesthesia Transfer of Care Note  Patient: Evelyn Beck  Procedure(s) Performed: APPENDECTOMY LAPAROSCOPIC (Abdomen)  Patient Location: PACU  Anesthesia Type:General  Level of Consciousness: awake, alert  and oriented  Airway & Oxygen Therapy: Patient Spontanous Breathing and Patient connected to face mask oxygen  Post-op Assessment: Report given to RN and Post -op Vital signs reviewed and stable  Post vital signs: Reviewed and stable  Last Vitals:  Vitals Value Taken Time  BP 176/126 12/12/20 1142  Temp    Pulse 85 12/12/20 1144  Resp 15 12/12/20 1144  SpO2 100 % 12/12/20 1144  Vitals shown include unvalidated device data.  Last Pain:  Vitals:   12/12/20 1001  TempSrc:   PainSc: 10-Worst pain ever      Patients Stated Pain Goal: 3 (12/12/20 0931)  Complications: No notable events documented.

## 2020-12-12 NOTE — Anesthesia Procedure Notes (Signed)
Procedure Name: Intubation Date/Time: 12/12/2020 10:56 AM Performed by: Fenton Candee D, CRNA Pre-anesthesia Checklist: Patient identified, Emergency Drugs available, Suction available and Patient being monitored Patient Re-evaluated:Patient Re-evaluated prior to induction Oxygen Delivery Method: Circle system utilized Preoxygenation: Pre-oxygenation with 100% oxygen Induction Type: IV induction Ventilation: Mask ventilation without difficulty Laryngoscope Size: Mac and 3 Tube type: Oral Tube size: 7.0 mm Number of attempts: 1 Airway Equipment and Method: Stylet Placement Confirmation: ETT inserted through vocal cords under direct vision, positive ETCO2 and breath sounds checked- equal and bilateral Secured at: 21 cm Tube secured with: Tape Dental Injury: Teeth and Oropharynx as per pre-operative assessment

## 2020-12-12 NOTE — Plan of Care (Signed)
Instructions were reviewed with patient. All questions were answered. Patient was transported to main entrance by wheelchair. ° °

## 2020-12-13 ENCOUNTER — Encounter (HOSPITAL_COMMUNITY): Payer: Self-pay | Admitting: General Surgery

## 2020-12-15 LAB — SURGICAL PATHOLOGY

## 2020-12-19 NOTE — Progress Notes (Signed)
Patient discharged on 10/21. RN noted to have removed 10mg  of oxy from pyxis to dispense to patient for pain. Patient did not want oxy for pain after all. Patient discharged before being able to return it back to the pyxis. 10mg  of oxy wasted in stericycle. , RN

## 2020-12-22 NOTE — Discharge Summary (Signed)
[facility] Discharge Summary   Patient ID: Evelyn Beck MRN: [id number] DOB/AGE: [birth year] 40 y.o.  Admit date: 12/11/2020 Discharge date: 12/22/2020  Admitting Diagnosis: Acute appendicitis  Discharge Diagnosis Patient Active Problem List   Diagnosis Date Noted   Appendicitis 12/11/2020   Abdominal pain 12/11/2020   Dysplasia of cervix, low grade (CIN 1) 03/21/2013    Consultants None Imaging: No results found.  Procedures Dr. Dwain Sarna (12/12/2020) - Laparoscopic Appendectomy  Hospital Course:  Evelyn Beck is a 40yo female who presented to [facility] 10/20 with acute onset right sided abdominal pain.  Workup showed acute appendicitis.  Patient was admitted and underwent procedure listed above.  Tolerated procedure well and was transferred to the floor.  Diet was advanced as tolerated.  On POD0 the patient was voiding well, tolerating diet, ambulating well, pain well controlled, vital signs stable, incisions c/d/i and felt stable for discharge home.  Patient will follow up as below and knows to call with questions or concerns.     Allergies as of 12/12/2020       Reactions   Contrast Media [iodinated Diagnostic Agents] Swelling   Swelling under eyes, redness to sclera, reported nasal congestion onset after admin of contrast media        Medication List     TAKE these medications    ondansetron 4 MG disintegrating tablet Commonly known as: Zofran ODT Take 1 tablet (4 mg total) by mouth every 8 (eight) hours as needed for nausea or vomiting.   oxyCODONE 5 MG immediate release tablet Commonly known as: Oxy IR/ROXICODONE Take 1 tablet (5 mg total) by mouth every 6 (six) hours as needed for moderate pain, severe pain or breakthrough pain.   Sronyx 0.1-20 MG-MCG tablet Generic drug: levonorgestrel-ethinyl estradiol Take 1 tablet by mouth daily.          Follow-up Information     [facility], [place]. Call.   Specialty: General  Surgery Why: We are working on your appointment, call to confirm Please arrive 30 minutes prior to your appointment to check in and fill out paperwork. Bring photo ID and insurance information. Contact information: [address] [place] [address] [phone]                Signed: Franne Forts, [facility] 12/22/2020, 2:38 PM Please see Amion for pager number during day hours 7:00am-4:30pm

## 2022-02-24 ENCOUNTER — Telehealth: Payer: Self-pay

## 2022-02-24 NOTE — Telephone Encounter (Signed)
Sending mychart msg. AS, CMA 

## 2022-11-29 ENCOUNTER — Other Ambulatory Visit: Payer: Self-pay | Admitting: Obstetrics & Gynecology

## 2022-11-29 DIAGNOSIS — R928 Other abnormal and inconclusive findings on diagnostic imaging of breast: Secondary | ICD-10-CM

## 2022-12-10 ENCOUNTER — Ambulatory Visit
Admission: RE | Admit: 2022-12-10 | Discharge: 2022-12-10 | Disposition: A | Discharge status: Home / Self Care | Payer: BC Managed Care – PPO | Source: Ambulatory Visit | Attending: Obstetrics & Gynecology | Admitting: Obstetrics & Gynecology

## 2022-12-10 ENCOUNTER — Ambulatory Visit
Admission: RE | Admit: 2022-12-10 | Discharge: 2022-12-10 | Disposition: A | Discharge status: Home / Self Care | Payer: Medicaid Other | Source: Ambulatory Visit | Attending: Obstetrics & Gynecology | Admitting: Obstetrics & Gynecology

## 2022-12-10 DIAGNOSIS — R928 Other abnormal and inconclusive findings on diagnostic imaging of breast: Secondary | ICD-10-CM
